# Patient Record
Sex: Male | Born: 1980 | Race: Asian | Hispanic: No | Marital: Single | Smoking: Never smoker
Health system: Southern US, Community
[De-identification: ages and names within clinical notes are randomized; demographics above are authoritative.]

## PROBLEM LIST (undated history)

## (undated) DIAGNOSIS — M79606 Pain in leg, unspecified: Secondary | ICD-10-CM

## (undated) DIAGNOSIS — R200 Anesthesia of skin: Secondary | ICD-10-CM

## (undated) DIAGNOSIS — M5126 Other intervertebral disc displacement, lumbar region: Secondary | ICD-10-CM

## (undated) DIAGNOSIS — M48061 Spinal stenosis, lumbar region without neurogenic claudication: Secondary | ICD-10-CM

## (undated) DIAGNOSIS — Z6828 Body mass index (BMI) 28.0-28.9, adult: Secondary | ICD-10-CM

## (undated) DIAGNOSIS — R030 Elevated blood-pressure reading, without diagnosis of hypertension: Secondary | ICD-10-CM

## (undated) DIAGNOSIS — R202 Paresthesia of skin: Secondary | ICD-10-CM

## (undated) DIAGNOSIS — M545 Low back pain, unspecified: Secondary | ICD-10-CM

## (undated) DIAGNOSIS — M47816 Spondylosis without myelopathy or radiculopathy, lumbar region: Secondary | ICD-10-CM

## (undated) DIAGNOSIS — M5416 Radiculopathy, lumbar region: Secondary | ICD-10-CM

## (undated) HISTORY — DX: Spondylosis without myelopathy or radiculopathy, lumbar region: M47.816

## (undated) HISTORY — DX: Low back pain, unspecified: M54.50

## (undated) HISTORY — DX: Other intervertebral disc displacement, lumbar region: M51.26

## (undated) HISTORY — DX: Radiculopathy, lumbar region: M54.16

## (undated) HISTORY — DX: Elevated blood-pressure reading, without diagnosis of hypertension: R03.0

## (undated) HISTORY — DX: Pain in leg, unspecified: M79.606

## (undated) HISTORY — DX: Anesthesia of skin: R20.0

## (undated) HISTORY — DX: Spinal stenosis, lumbar region without neurogenic claudication: M48.061

## (undated) HISTORY — DX: Anesthesia of skin: R20.2

## (undated) HISTORY — DX: Body mass index (BMI) 28.0-28.9, adult: Z68.28

---
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
## 2018-10-16 ENCOUNTER — Other Ambulatory Visit: Payer: Self-pay | Admitting: Neurosurgery

## 2018-10-23 ENCOUNTER — Ambulatory Visit (HOSPITAL_COMMUNITY): Admission: RE | Admit: 2018-10-23 | Payer: BC Managed Care – PPO | Source: Home / Self Care | Admitting: Neurosurgery

## 2018-10-23 SURGERY — LUMBAR LAMINECTOMY/DECOMPRESSION MICRODISCECTOMY 1 LEVEL
Anesthesia: General | Laterality: Left

## 2018-12-19 ENCOUNTER — Other Ambulatory Visit: Payer: Self-pay | Admitting: *Deleted

## 2018-12-22 ENCOUNTER — Other Ambulatory Visit: Payer: Self-pay | Admitting: *Deleted

## 2018-12-23 ENCOUNTER — Other Ambulatory Visit: Payer: Self-pay | Admitting: Neurosurgery

## 2018-12-23 ENCOUNTER — Other Ambulatory Visit: Payer: Self-pay | Admitting: *Deleted

## 2019-02-02 ENCOUNTER — Ambulatory Visit (INDEPENDENT_AMBULATORY_CARE_PROVIDER_SITE_OTHER): Payer: BC Managed Care – PPO | Admitting: Surgery

## 2019-02-02 ENCOUNTER — Encounter: Payer: Self-pay | Admitting: Surgery

## 2019-02-02 ENCOUNTER — Other Ambulatory Visit: Payer: Self-pay

## 2019-02-02 VITALS — BP 119/83 | HR 84 | Temp 97.9°F | Resp 20 | Ht 70.0 in | Wt 202.8 lb

## 2019-02-02 DIAGNOSIS — M479 Spondylosis, unspecified: Secondary | ICD-10-CM

## 2019-02-02 NOTE — H&P (View-Only) (Signed)
Vascular and Vein Specialist of [facility]  Patient name: Cody Fletcher MRN: [id number] DOB: 1981-02-01 Sex: male   REQUESTING PROVIDER:    Dr. Venetia Maxon   REASON FOR CONSULT:     anterior exposure L5-S1  HISTORY OF PRESENT ILLNESS:   Cody Fletcher is a 38 y.o. male, who is referred for anterior exposure of L5-S1.  He has not had any abdominal surgeries in the past.  Surgical correction of his sciatica has been recommended.  He is a non-smoker.  PAST MEDICAL HISTORY    Past Medical History:  Diagnosis Date  . Body mass index 28.0-28.9, adult   . Disc displacement, lumbar   . Elevated blood pressure reading without diagnosis of hypertension   . Leg pain   . Low back pain    With sciatica presence unspecified  . Lumbar foraminal stenosis   . Lumbar radiculopathy   . Numbness and tingling of both legs    Occasional  . Spondylosis of lumbar region without myelopathy or radiculopathy      FAMILY HISTORY   Family History  Problem Relation Age of Onset  . Arthritis Mother   . Diabetes Father     SOCIAL HISTORY:   Social History   Socioeconomic History  . Marital status: Single    Spouse name: Not on file  . Number of children: Not on file  . Years of education: Not on file  . Highest education level: Not on file  Occupational History  . Not on file  Tobacco Use  . Smoking status: Never Smoker  . Smokeless tobacco: Never Used  Substance and Sexual Activity  . Alcohol use: Not Currently  . Drug use: Never  . Sexual activity: Not on file  Other Topics Concern  . Not on file  Social History Narrative  . Not on file   Social Determinants of Health   Financial Resource Strain:   . Difficulty of Paying Living Expenses: Not on file  Food Insecurity:   . Worried About Programme researcher, broadcasting/film/video in the Last Year: Not on file  . Ran Out of Food in the Last Year: Not on file  Transportation Needs:   . Lack of Transportation (Medical): Not  on file  . Lack of Transportation (Non-Medical): Not on file  Physical Activity:   . Days of Exercise per Week: Not on file  . Minutes of Exercise per Session: Not on file  Stress:   . Feeling of Stress : Not on file  Social Connections:   . Frequency of Communication with Friends and Family: Not on file  . Frequency of Social Gatherings with Friends and Family: Not on file  . Attends Religious Services: Not on file  . Active Member of Clubs or Organizations: Not on file  . Attends Banker Meetings: Not on file  . Marital Status: Not on file  Intimate Partner Violence:   . Fear of Current or Ex-Partner: Not on file  . Emotionally Abused: Not on file  . Physically Abused: Not on file  . Sexually Abused: Not on file    ALLERGIES:    No Known Allergies  CURRENT MEDICATIONS:    Current Outpatient Medications  Medication Sig Dispense Refill  . cyclobenzaprine (FLEXERIL) 5 MG tablet Take 5 mg by mouth daily as needed for muscle spasms.    Marland Kitchen ibuprofen (ADVIL) 800 MG tablet Take 800 mg by mouth at bedtime.    . metFORMIN (GLUCOPHAGE-XR) 500 MG 24 hr tablet Take 500  mg by mouth 3 (three) times a week. At night     No current facility-administered medications for this visit.    REVIEW OF SYSTEMS:   [X]  denotes positive finding, [ ]  denotes negative finding Cardiac  Comments:  Chest pain or chest pressure:    Shortness of breath upon exertion:    Short of breath when lying flat:    Irregular heart rhythm:        Vascular    Pain in calf, thigh, or hip brought on by ambulation:    Pain in feet at night that wakes you up from your sleep:     Blood clot in your veins:    Leg swelling:         Pulmonary    Oxygen at home:    Productive cough:     Wheezing:         Neurologic    Sudden weakness in arms or legs:     Sudden numbness in arms or legs:     Sudden onset of difficulty speaking or slurred speech:    Temporary loss of vision in one eye:     Problems  with dizziness:         Gastrointestinal    Blood in stool:      Vomited blood:         Genitourinary    Burning when urinating:     Blood in urine:        Psychiatric    Major depression:         Hematologic    Bleeding problems:    Problems with blood clotting too easily:        Skin    Rashes or ulcers:        Constitutional    Fever or chills:     PHYSICAL EXAM:   Vitals:   02/02/19 1031  BP: 119/83  Pulse: 84  Resp: 20  Temp: 97.9 F (36.6 C)  SpO2: 99%  Weight: 202 lb 12.8 oz (92 kg)  Height: 5\' 10"  (1.778 m)    GENERAL: The patient is a well-nourished male, in no acute distress. The vital signs are documented above. CARDIAC: There is a regular rate and rhythm.  VASCULAR: Palpable pedal pulses PULMONARY: Nonlabored respirations ABDOMEN: Soft and non-tender with normal pitched bowel sounds.  MUSCULOSKELETAL: There are no major deformities or cyanosis. NEUROLOGIC: No focal weakness or paresthesias are detected. SKIN: There are no ulcers or rashes noted. PSYCHIATRIC: The patient has a normal affect.  STUDIES:   I have reviewed his CT scan which shows a relatively high aortic bifurcation with no significant calcification.  ASSESSMENT and PLAN   Degenerative back disease: We discussed the anterior approach to the L5-S1 disc space.  We discussed potential risks and benefits including but not limited to injury to the iliac artery and vein, ureteral injury.  We discussed the risk of retrograde ejaculation as well as hernia and wound complications.  All of his questions were answered.  His surgery is scheduled for January 7.   Leia Alf, MD, FACS Vascular and Vein Specialists of [facility] [phone] Pager [phone]

## 2019-02-02 NOTE — Progress Notes (Signed)
Vascular and Vein Specialist of [facility]  Patient name: Cody Fletcher MRN: [id number] DOB: 1981-02-01 Sex: male   REQUESTING PROVIDER:    Dr. Venetia Maxon   REASON FOR CONSULT:     anterior exposure L5-S1  HISTORY OF PRESENT ILLNESS:   Cody Fletcher is a 38 y.o. male, who is referred for anterior exposure of L5-S1.  He has not had any abdominal surgeries in the past.  Surgical correction of his sciatica has been recommended.  He is a non-smoker.  PAST MEDICAL HISTORY    Past Medical History:  Diagnosis Date  . Body mass index 28.0-28.9, adult   . Disc displacement, lumbar   . Elevated blood pressure reading without diagnosis of hypertension   . Leg pain   . Low back pain    With sciatica presence unspecified  . Lumbar foraminal stenosis   . Lumbar radiculopathy   . Numbness and tingling of both legs    Occasional  . Spondylosis of lumbar region without myelopathy or radiculopathy      FAMILY HISTORY   Family History  Problem Relation Age of Onset  . Arthritis Mother   . Diabetes Father     SOCIAL HISTORY:   Social History   Socioeconomic History  . Marital status: Single    Spouse name: Not on file  . Number of children: Not on file  . Years of education: Not on file  . Highest education level: Not on file  Occupational History  . Not on file  Tobacco Use  . Smoking status: Never Smoker  . Smokeless tobacco: Never Used  Substance and Sexual Activity  . Alcohol use: Not Currently  . Drug use: Never  . Sexual activity: Not on file  Other Topics Concern  . Not on file  Social History Narrative  . Not on file   Social Determinants of Health   Financial Resource Strain:   . Difficulty of Paying Living Expenses: Not on file  Food Insecurity:   . Worried About Programme researcher, broadcasting/film/video in the Last Year: Not on file  . Ran Out of Food in the Last Year: Not on file  Transportation Needs:   . Lack of Transportation (Medical): Not  on file  . Lack of Transportation (Non-Medical): Not on file  Physical Activity:   . Days of Exercise per Week: Not on file  . Minutes of Exercise per Session: Not on file  Stress:   . Feeling of Stress : Not on file  Social Connections:   . Frequency of Communication with Friends and Family: Not on file  . Frequency of Social Gatherings with Friends and Family: Not on file  . Attends Religious Services: Not on file  . Active Member of Clubs or Organizations: Not on file  . Attends Banker Meetings: Not on file  . Marital Status: Not on file  Intimate Partner Violence:   . Fear of Current or Ex-Partner: Not on file  . Emotionally Abused: Not on file  . Physically Abused: Not on file  . Sexually Abused: Not on file    ALLERGIES:    No Known Allergies  CURRENT MEDICATIONS:    Current Outpatient Medications  Medication Sig Dispense Refill  . cyclobenzaprine (FLEXERIL) 5 MG tablet Take 5 mg by mouth daily as needed for muscle spasms.    Marland Kitchen ibuprofen (ADVIL) 800 MG tablet Take 800 mg by mouth at bedtime.    . metFORMIN (GLUCOPHAGE-XR) 500 MG 24 hr tablet Take 500  mg by mouth 3 (three) times a week. At night     No current facility-administered medications for this visit.    REVIEW OF SYSTEMS:   [X]  denotes positive finding, [ ]  denotes negative finding Cardiac  Comments:  Chest pain or chest pressure:    Shortness of breath upon exertion:    Short of breath when lying flat:    Irregular heart rhythm:        Vascular    Pain in calf, thigh, or hip brought on by ambulation:    Pain in feet at night that wakes you up from your sleep:     Blood clot in your veins:    Leg swelling:         Pulmonary    Oxygen at home:    Productive cough:     Wheezing:         Neurologic    Sudden weakness in arms or legs:     Sudden numbness in arms or legs:     Sudden onset of difficulty speaking or slurred speech:    Temporary loss of vision in one eye:     Problems  with dizziness:         Gastrointestinal    Blood in stool:      Vomited blood:         Genitourinary    Burning when urinating:     Blood in urine:        Psychiatric    Major depression:         Hematologic    Bleeding problems:    Problems with blood clotting too easily:        Skin    Rashes or ulcers:        Constitutional    Fever or chills:     PHYSICAL EXAM:   Vitals:   02/02/19 1031  BP: 119/83  Pulse: 84  Resp: 20  Temp: 97.9 F (36.6 C)  SpO2: 99%  Weight: 202 lb 12.8 oz (92 kg)  Height: 5\' 10"  (1.778 m)    GENERAL: The patient is a well-nourished male, in no acute distress. The vital signs are documented above. CARDIAC: There is a regular rate and rhythm.  VASCULAR: Palpable pedal pulses PULMONARY: Nonlabored respirations ABDOMEN: Soft and non-tender with normal pitched bowel sounds.  MUSCULOSKELETAL: There are no major deformities or cyanosis. NEUROLOGIC: No focal weakness or paresthesias are detected. SKIN: There are no ulcers or rashes noted. PSYCHIATRIC: The patient has a normal affect.  STUDIES:   I have reviewed his CT scan which shows a relatively high aortic bifurcation with no significant calcification.  ASSESSMENT and PLAN   Degenerative back disease: We discussed the anterior approach to the L5-S1 disc space.  We discussed potential risks and benefits including but not limited to injury to the iliac artery and vein, ureteral injury.  We discussed the risk of retrograde ejaculation as well as hernia and wound complications.  All of his questions were answered.  His surgery is scheduled for January 7.   Leia Alf, MD, FACS [facility] and Vein Specialists of [facility] [phone] Pager [phone]

## 2019-02-17 NOTE — Progress Notes (Signed)
[id number] - [place], [place] - [address] [place] [address] Phone: [phone] Fax: [phone]      Your procedure is scheduled on Thursday, January 7th.  Report to [facility] Main Entrance "A" at 5:30 A.M., and check in at the Admitting office.  Call this number if you have problems the morning of surgery:  [phone]  Call [phone] if you have any questions prior to your surgery date Monday-Friday 8am-4pm    Remember:  Do not eat or drink after midnight the night before your surgery     Take these medicines the morning of surgery with A SIP OF WATER   NONE  7 days prior to surgery STOP taking any Aspirin (unless otherwise instructed by your surgeon), Aleve, Naproxen, Ibuprofen, Motrin, Advil, Goody's, BC's, all herbal medications, fish oil, and all vitamins.   WHAT DO I DO ABOUT MY DIABETES MEDICATION?   Marland Kitchen Do not take oral diabetes medicines (pills) the morning of surgery. - Metformin   HOW TO MANAGE YOUR DIABETES BEFORE AND AFTER SURGERY  Why is it important to control my blood sugar before and after surgery? . Improving blood sugar levels before and after surgery helps healing and can limit problems. . A way of improving blood sugar control is eating a healthy diet by: o  Eating less sugar and carbohydrates o  Increasing activity/exercise o  Talking with your doctor about reaching your blood sugar goals . High blood sugars (greater than 180 mg/dL) can raise your risk of infections and slow your recovery, so you will need to focus on controlling your diabetes during the weeks before surgery. . Make sure that the doctor who takes care of your diabetes knows about your planned surgery including the date and location.  How do I manage my blood sugar before surgery? . Check your blood sugar at least 4 times a day, starting 2 days before surgery, to make sure that the level is  not too high or low. . Check your blood sugar the morning of your surgery when you wake up and every 2 hours until you get to the Short Stay unit. o If your blood sugar is less than 70 mg/dL, you will need to treat for low blood sugar: - Do not take insulin. - Treat a low blood sugar (less than 70 mg/dL) with  cup of clear juice (cranberry or apple), 4 glucose tablets, OR glucose gel. - Recheck blood sugar in 15 minutes after treatment (to make sure it is greater than 70 mg/dL). If your blood sugar is not greater than 70 mg/dL on recheck, call [phone] for further instructions. . Report your blood sugar to the short stay nurse when you get to Short Stay.  . If you are admitted to the hospital after surgery: o Your blood sugar will be checked by the staff and you will probably be given insulin after surgery (instead of oral diabetes medicines) to make sure you have good blood sugar levels. o The goal for blood sugar control after surgery is 80-180 mg/dL.    The Morning of Surgery  Do not wear jewelry.  Do not wear lotions, powders, colognes, or deodorant  Men may shave face and neck.  Do not bring valuables to the hospital.  [facility] is not responsible for any belongings or valuables.  If you are a smoker, DO NOT Smoke 24 hours prior to surgery  If you wear a CPAP at night please bring your mask, tubing, and machine the morning of surgery   Remember that you must have someone to transport you home after your surgery, and remain with you for 24 hours if you are discharged the same day.   Please bring cases for contacts, glasses, hearing aids, dentures or bridgework because it cannot be worn into surgery.    Leave your suitcase in the car.  After surgery it may be brought to your room.  For patients admitted to the hospital, discharge time will be determined by your treatment team.  Patients discharged the day of surgery will not be allowed to drive home.    Special  instructions:   [place]- Preparing For Surgery  Before surgery, you can play an important role. Because skin is not sterile, your skin needs to be as free of germs as possible. You can reduce the number of germs on your skin by washing with CHG (chlorahexidine gluconate) Soap before surgery.  CHG is an antiseptic cleaner which kills germs and bonds with the skin to continue killing germs even after washing.    Oral Hygiene is also important to reduce your risk of infection.  Remember - BRUSH YOUR TEETH THE MORNING OF SURGERY WITH YOUR REGULAR TOOTHPASTE  Please do not use if you have an allergy to CHG or antibacterial soaps. If your skin becomes reddened/irritated stop using the CHG.  Do not shave (including legs and underarms) for at least 48 hours prior to first CHG shower. It is OK to shave your face.  Please follow these instructions carefully.   1. Shower the NIGHT BEFORE SURGERY and the MORNING OF SURGERY with CHG Soap.   2. If you chose to wash your hair, wash your hair first as usual with your normal shampoo.  3. After you shampoo, rinse your hair and body thoroughly to remove the shampoo.  4. Use CHG as you would any other liquid soap. You can apply CHG directly to the skin and wash gently with a scrungie or a clean washcloth.   5. Apply the CHG Soap to your body ONLY FROM THE NECK DOWN.  Do not use on open wounds or open sores. Avoid contact with your eyes, ears, mouth and genitals (private parts). Wash Face and genitals (private parts)  with your normal soap.   6. Wash thoroughly, paying special attention to the area where your surgery will be performed.  7. Thoroughly rinse your body with warm water from the neck down.  8. DO NOT shower/wash with your normal soap after using and rinsing off the CHG Soap.  9. Pat yourself dry with a CLEAN TOWEL.  10. Wear CLEAN PAJAMAS to bed the night before surgery, wear comfortable clothes the morning of surgery  11. Place CLEAN  SHEETS on your bed the night of your first shower and DO NOT SLEEP WITH PETS.    Day of Surgery:  Please shower the morning of surgery with the CHG soap Do not apply any deodorants/lotions. Please wear clean clothes to the hospital/surgery center.   Remember to brush your teeth WITH YOUR REGULAR TOOTHPASTE.   Please read over the following fact sheets that you were given.

## 2019-02-18 ENCOUNTER — Encounter (HOSPITAL_COMMUNITY)
Admission: RE | Admit: 2019-02-18 | Discharge: 2019-02-18 | Disposition: A | Discharge status: Home / Self Care | Payer: BC Managed Care – PPO | Source: Ambulatory Visit | Attending: Neurosurgery | Admitting: Neurosurgery

## 2019-02-18 ENCOUNTER — Encounter (HOSPITAL_COMMUNITY): Payer: Self-pay

## 2019-02-18 ENCOUNTER — Other Ambulatory Visit: Payer: Self-pay

## 2019-02-18 ENCOUNTER — Other Ambulatory Visit (HOSPITAL_COMMUNITY)
Admission: RE | Admit: 2019-02-18 | Discharge: 2019-02-18 | Disposition: A | Discharge status: Home / Self Care | Payer: BC Managed Care – PPO | Source: Ambulatory Visit | Attending: Neurosurgery | Admitting: Neurosurgery

## 2019-02-18 DIAGNOSIS — R9431 Abnormal electrocardiogram [ECG] [EKG]: Secondary | ICD-10-CM | POA: Insufficient documentation

## 2019-02-18 DIAGNOSIS — Z01818 Encounter for other preprocedural examination: Secondary | ICD-10-CM | POA: Insufficient documentation

## 2019-02-18 DIAGNOSIS — Z20822 Contact with and (suspected) exposure to covid-19: Secondary | ICD-10-CM | POA: Insufficient documentation

## 2019-02-18 DIAGNOSIS — M5416 Radiculopathy, lumbar region: Secondary | ICD-10-CM | POA: Insufficient documentation

## 2019-02-18 DIAGNOSIS — Z01812 Encounter for preprocedural laboratory examination: Secondary | ICD-10-CM | POA: Insufficient documentation

## 2019-02-18 DIAGNOSIS — I4510 Unspecified right bundle-branch block: Secondary | ICD-10-CM | POA: Insufficient documentation

## 2019-02-18 DIAGNOSIS — R7303 Prediabetes: Secondary | ICD-10-CM | POA: Insufficient documentation

## 2019-02-18 LAB — BASIC METABOLIC PANEL
Anion gap: 8 (ref 5–15)
BUN: 7 mg/dL (ref 6–20)
CO2: 27 mmol/L (ref 22–32)
Calcium: 9.3 mg/dL (ref 8.9–10.3)
Chloride: 103 mmol/L (ref 98–111)
Creatinine, Ser: 0.77 mg/dL (ref 0.61–1.24)
GFR calc Af Amer: >60 mL/min (ref >60)
GFR calc non Af Amer: >60 mL/min (ref >60)
Glucose, Bld: 133 mg/dL — ABNORMAL HIGH (ref 70–99)
Potassium: 3.8 mmol/L (ref 3.5–5.1)
Sodium: 138 mmol/L (ref 135–145)

## 2019-02-18 LAB — SARS CORONAVIRUS 2 (TAT 6-24 HRS): SARS Coronavirus 2: NEGATIVE

## 2019-02-18 LAB — CBC
HCT: 48.2 % (ref 39.0–52.0)
Hemoglobin: 15.3 g/dL (ref 13.0–17.0)
MCH: 26.4 pg (ref 26.0–34.0)
MCHC: 31.7 g/dL (ref 30.0–36.0)
MCV: 83.1 fL (ref 80.0–100.0)
Platelets: 275 10*3/uL (ref 150–400)
RBC: 5.8 MIL/uL (ref 4.22–5.81)
RDW: 13.1 % (ref 11.5–15.5)
WBC: 8.6 10*3/uL (ref 4.0–10.5)
nRBC: 0 % (ref 0.0–0.2)

## 2019-02-18 LAB — HEMOGLOBIN A1C
Hgb A1c MFr Bld: 6.4 % — ABNORMAL HIGH (ref 4.8–5.6)
Mean Plasma Glucose: 136.98 mg/dL

## 2019-02-18 LAB — GLUCOSE, CAPILLARY: Glucose-Capillary: 150 mg/dL — ABNORMAL HIGH (ref 70–99)

## 2019-02-18 LAB — TYPE AND SCREEN
ABO/RH(D): O POS
Antibody Screen: NEGATIVE

## 2019-02-18 LAB — SURGICAL PCR SCREEN
MRSA, PCR: NEGATIVE
Staphylococcus aureus: POSITIVE — AB

## 2019-02-18 LAB — ABO/RH: ABO/RH(D): O POS

## 2019-02-18 NOTE — Progress Notes (Signed)
PCP - Dr. Paralee Cancel - [place], [place]  PPM/ICD - Denies  Chest x-ray - Requested EKG - 02/18/2019 Stress Test - Denies ECHO - Requested Cardiac Cath - Denies  Sleep Study - Denies  Patient is pre-diabetic. Patient does not check CBG.  Aspirin Instructions: Patient stopped on 02/12/2019.  ERAS Protcol - No   COVID TEST- Scheduled today 02/18/2019   Coronavirus Screening  Have you experienced the following symptoms:  Cough yes/no: No Fever (>100.59F)  yes/no: No Runny nose yes/no: No Sore throat yes/no: No Difficulty breathing/shortness of breath  yes/no: No  Have you or a family member traveled in the last 14 days and where? yes/no: No   If the patient indicates "YES" to the above questions, their PAT will be rescheduled to limit the exposure to others and, the surgeon will be notified. THE PATIENT WILL NEED TO BE ASYMPTOMATIC FOR 14 DAYS.   If the patient is not experiencing any of these symptoms, the PAT nurse will instruct them to NOT bring anyone with them to their appointment since they may have these symptoms or traveled as well.   Please remind your patients and families that hospital visitation restrictions are in effect and the importance of the restrictions.     Anesthesia review: Yes, abnormal EKG  Patient denies shortness of breath, fever, cough and chest pain at PAT appointment   All instructions explained to the patient, with a verbal understanding of the material. Patient agrees to go over the instructions while at home for a better understanding. Patient also instructed to self quarantine after being tested for COVID-19. The opportunity to ask questions was provided.

## 2019-02-19 ENCOUNTER — Ambulatory Visit (HOSPITAL_COMMUNITY): Payer: BC Managed Care – PPO

## 2019-02-19 ENCOUNTER — Ambulatory Visit (HOSPITAL_COMMUNITY): Payer: BC Managed Care – PPO | Admitting: Anesthesiology

## 2019-02-19 ENCOUNTER — Encounter (HOSPITAL_COMMUNITY): Payer: Self-pay | Admitting: Neurosurgery

## 2019-02-19 ENCOUNTER — Ambulatory Visit (HOSPITAL_COMMUNITY): Payer: BC Managed Care – PPO | Admitting: Physician Assistant

## 2019-02-19 ENCOUNTER — Encounter (HOSPITAL_COMMUNITY)
Admission: AD | Disposition: A | Discharge status: Home / Self Care | Payer: Self-pay | Source: Home / Self Care | Attending: Neurosurgery

## 2019-02-19 ENCOUNTER — Inpatient Hospital Stay (HOSPITAL_COMMUNITY)
Admission: AD | Admit: 2019-02-19 | Discharge: 2019-02-20 | DRG: 460 | Disposition: A | Discharge status: Home / Self Care | Payer: BC Managed Care – PPO | Attending: Neurosurgery | Admitting: Neurosurgery

## 2019-02-19 DIAGNOSIS — Z20822 Contact with and (suspected) exposure to covid-19: Secondary | ICD-10-CM | POA: Diagnosis present

## 2019-02-19 DIAGNOSIS — Z833 Family history of diabetes mellitus: Secondary | ICD-10-CM

## 2019-02-19 DIAGNOSIS — M5116 Intervertebral disc disorders with radiculopathy, lumbar region: Secondary | ICD-10-CM | POA: Diagnosis present

## 2019-02-19 DIAGNOSIS — Z419 Encounter for procedure for purposes other than remedying health state, unspecified: Secondary | ICD-10-CM

## 2019-02-19 DIAGNOSIS — Z8261 Family history of arthritis: Secondary | ICD-10-CM

## 2019-02-19 DIAGNOSIS — M5117 Intervertebral disc disorders with radiculopathy, lumbosacral region: Secondary | ICD-10-CM | POA: Diagnosis not present

## 2019-02-19 DIAGNOSIS — M4807 Spinal stenosis, lumbosacral region: Secondary | ICD-10-CM | POA: Diagnosis present

## 2019-02-19 DIAGNOSIS — I10 Essential (primary) hypertension: Secondary | ICD-10-CM | POA: Diagnosis present

## 2019-02-19 DIAGNOSIS — M5126 Other intervertebral disc displacement, lumbar region: Secondary | ICD-10-CM | POA: Diagnosis present

## 2019-02-19 DIAGNOSIS — M48061 Spinal stenosis, lumbar region without neurogenic claudication: Secondary | ICD-10-CM | POA: Diagnosis present

## 2019-02-19 DIAGNOSIS — M4726 Other spondylosis with radiculopathy, lumbar region: Secondary | ICD-10-CM | POA: Diagnosis present

## 2019-02-19 DIAGNOSIS — M4727 Other spondylosis with radiculopathy, lumbosacral region: Secondary | ICD-10-CM | POA: Diagnosis not present

## 2019-02-19 HISTORY — PX: ABDOMINAL EXPOSURE: SHX5708

## 2019-02-19 HISTORY — PX: ANTERIOR LUMBAR FUSION: SHX1170

## 2019-02-19 LAB — GLUCOSE, CAPILLARY
Glucose-Capillary: 156 mg/dL — ABNORMAL HIGH (ref 70–99)
Glucose-Capillary: 242 mg/dL — ABNORMAL HIGH (ref 70–99)

## 2019-02-19 SURGERY — ANTERIOR LUMBAR FUSION 1 LEVEL
Anesthesia: General | Site: Spine Lumbar

## 2019-02-19 MED ORDER — ACETAMINOPHEN 325 MG PO TABS: 650.0000 mg | ORAL_TABLET | ORAL | Status: DC | PRN

## 2019-02-19 MED ORDER — OXYCODONE HCL 5 MG PO TABS
5.0000 mg | ORAL_TABLET | ORAL | Status: DC | PRN
  Administered 2019-02-19 (×3): 10 mg via ORAL
  Filled 2019-02-19 (×3): qty 2

## 2019-02-19 MED ORDER — HYDROMORPHONE HCL 1 MG/ML IJ SOLN
0.2500 mg | INTRAMUSCULAR | Status: DC | PRN
  Administered 2019-02-19: 0.5 mg via INTRAVENOUS

## 2019-02-19 MED ORDER — ACETAMINOPHEN 500 MG PO TABS: 1000.0000 mg | ORAL_TABLET | Freq: Once | ORAL | Status: DC

## 2019-02-19 MED ORDER — INSULIN ASPART 100 UNIT/ML ~~LOC~~ SOLN: 0.0000 [IU] | Freq: Every day | SUBCUTANEOUS | Status: DC

## 2019-02-19 MED ORDER — FENTANYL CITRATE (PF) 250 MCG/5ML IJ SOLN
INTRAMUSCULAR | Status: AC
  Filled 2019-02-19: qty 5

## 2019-02-19 MED ORDER — HEMOSTATIC AGENTS (NO CHARGE) OPTIME
TOPICAL | Status: DC | PRN
  Administered 2019-02-19: 1 via TOPICAL

## 2019-02-19 MED ORDER — INSULIN ASPART 100 UNIT/ML ~~LOC~~ SOLN
0.0000 [IU] | Freq: Three times a day (TID) | SUBCUTANEOUS | Status: DC
  Administered 2019-02-19: 3 [IU] via SUBCUTANEOUS

## 2019-02-19 MED ORDER — 0.9 % SODIUM CHLORIDE (POUR BTL) OPTIME
TOPICAL | Status: DC | PRN
  Administered 2019-02-19: 1000 mL

## 2019-02-19 MED ORDER — HYDROCODONE-ACETAMINOPHEN 5-325 MG PO TABS
1.0000 | ORAL_TABLET | ORAL | Status: DC | PRN
  Administered 2019-02-20 (×2): 2 via ORAL
  Filled 2019-02-19: qty 2
  Filled 2019-02-19 (×2): qty 1

## 2019-02-19 MED ORDER — BISACODYL 10 MG RE SUPP: 10.0000 mg | Freq: Every day | RECTAL | Status: DC | PRN

## 2019-02-19 MED ORDER — PHENYLEPHRINE HCL-NACL 10-0.9 MG/250ML-% IV SOLN
INTRAVENOUS | Status: DC | PRN
  Administered 2019-02-19: 50 ug/min via INTRAVENOUS

## 2019-02-19 MED ORDER — ACETAMINOPHEN 10 MG/ML IV SOLN
INTRAVENOUS | Status: AC
  Filled 2019-02-19: qty 100

## 2019-02-19 MED ORDER — CHLORHEXIDINE GLUCONATE CLOTH 2 % EX PADS: 6.0000 | MEDICATED_PAD | Freq: Once | CUTANEOUS | Status: DC

## 2019-02-19 MED ORDER — CHLORHEXIDINE GLUCONATE 4 % EX LIQD: 60.0000 mL | Freq: Once | CUTANEOUS | Status: DC

## 2019-02-19 MED ORDER — THROMBIN 5000 UNITS EX SOLR
CUTANEOUS | Status: AC
  Filled 2019-02-19: qty 15000

## 2019-02-19 MED ORDER — CEFAZOLIN SODIUM-DEXTROSE 2-4 GM/100ML-% IV SOLN
2.0000 g | Freq: Three times a day (TID) | INTRAVENOUS | Status: AC
  Administered 2019-02-19 (×2): 2 g via INTRAVENOUS
  Filled 2019-02-19 (×2): qty 100

## 2019-02-19 MED ORDER — INSULIN ASPART 100 UNIT/ML ~~LOC~~ SOLN
4.0000 [IU] | Freq: Three times a day (TID) | SUBCUTANEOUS | Status: DC
  Administered 2019-02-19: 4 [IU] via SUBCUTANEOUS

## 2019-02-19 MED ORDER — LIDOCAINE-EPINEPHRINE 1 %-1:100000 IJ SOLN
INTRAMUSCULAR | Status: AC
  Filled 2019-02-19: qty 1

## 2019-02-19 MED ORDER — BUPIVACAINE HCL (PF) 0.5 % IJ SOLN
INTRAMUSCULAR | Status: AC
  Filled 2019-02-19: qty 30

## 2019-02-19 MED ORDER — THROMBIN 5000 UNITS EX SOLR
CUTANEOUS | Status: DC | PRN
  Administered 2019-02-19 (×2): 5000 [IU] via TOPICAL

## 2019-02-19 MED ORDER — ROCURONIUM BROMIDE 10 MG/ML (PF) SYRINGE
PREFILLED_SYRINGE | INTRAVENOUS | Status: DC | PRN
  Administered 2019-02-19 (×4): 20 mg via INTRAVENOUS
  Administered 2019-02-19: 80 mg via INTRAVENOUS

## 2019-02-19 MED ORDER — SODIUM CHLORIDE 0.9% FLUSH: 3.0000 mL | INTRAVENOUS | Status: DC | PRN

## 2019-02-19 MED ORDER — SODIUM CHLORIDE 0.9% FLUSH
3.0000 mL | Freq: Two times a day (BID) | INTRAVENOUS | Status: DC
  Administered 2019-02-19: 3 mL via INTRAVENOUS

## 2019-02-19 MED ORDER — OXYCODONE HCL 5 MG/5ML PO SOLN: 5.0000 mg | Freq: Once | ORAL | Status: DC | PRN

## 2019-02-19 MED ORDER — DEXAMETHASONE SODIUM PHOSPHATE 10 MG/ML IJ SOLN
INTRAMUSCULAR | Status: AC
  Filled 2019-02-19: qty 1

## 2019-02-19 MED ORDER — METHOCARBAMOL 500 MG PO TABS
ORAL_TABLET | ORAL | Status: AC
  Filled 2019-02-19: qty 1

## 2019-02-19 MED ORDER — ONDANSETRON HCL 4 MG/2ML IJ SOLN: INTRAMUSCULAR | Status: DC | PRN

## 2019-02-19 MED ORDER — ROCURONIUM BROMIDE 10 MG/ML (PF) SYRINGE
PREFILLED_SYRINGE | INTRAVENOUS | Status: AC
  Filled 2019-02-19: qty 10

## 2019-02-19 MED ORDER — PHENOL 1.4 % MT LIQD: 1.0000 | OROMUCOSAL | Status: DC | PRN

## 2019-02-19 MED ORDER — PROPOFOL 10 MG/ML IV BOLUS
INTRAVENOUS | Status: DC | PRN
  Administered 2019-02-19: 200 mg via INTRAVENOUS

## 2019-02-19 MED ORDER — SUGAMMADEX SODIUM 200 MG/2ML IV SOLN
INTRAVENOUS | Status: DC | PRN
  Administered 2019-02-19: 200 mg via INTRAVENOUS

## 2019-02-19 MED ORDER — METHOCARBAMOL 1000 MG/10ML IJ SOLN
500.0000 mg | Freq: Four times a day (QID) | INTRAVENOUS | Status: DC | PRN
  Filled 2019-02-19: qty 5

## 2019-02-19 MED ORDER — ZOLPIDEM TARTRATE 5 MG PO TABS: 5.0000 mg | ORAL_TABLET | Freq: Every evening | ORAL | Status: DC | PRN

## 2019-02-19 MED ORDER — METHOCARBAMOL 500 MG PO TABS
500.0000 mg | ORAL_TABLET | Freq: Four times a day (QID) | ORAL | Status: DC | PRN
  Administered 2019-02-19 – 2019-02-20 (×4): 500 mg via ORAL
  Filled 2019-02-19 (×3): qty 1

## 2019-02-19 MED ORDER — ONDANSETRON HCL 4 MG PO TABS: 4.0000 mg | ORAL_TABLET | Freq: Four times a day (QID) | ORAL | Status: DC | PRN

## 2019-02-19 MED ORDER — MIDAZOLAM HCL 2 MG/2ML IJ SOLN
INTRAMUSCULAR | Status: AC
  Filled 2019-02-19: qty 2

## 2019-02-19 MED ORDER — METFORMIN HCL ER 500 MG PO TB24
500.0000 mg | ORAL_TABLET | ORAL | Status: DC
  Filled 2019-02-19: qty 1

## 2019-02-19 MED ORDER — LIDOCAINE 2% (20 MG/ML) 5 ML SYRINGE
INTRAMUSCULAR | Status: DC | PRN
  Administered 2019-02-19: 60 mg via INTRAVENOUS

## 2019-02-19 MED ORDER — MIDAZOLAM HCL 2 MG/2ML IJ SOLN
INTRAMUSCULAR | Status: DC | PRN
  Administered 2019-02-19: 2 mg via INTRAVENOUS

## 2019-02-19 MED ORDER — CEFAZOLIN SODIUM-DEXTROSE 2-4 GM/100ML-% IV SOLN
2.0000 g | INTRAVENOUS | Status: AC
  Administered 2019-02-19: 08:00:00 2 g via INTRAVENOUS
  Filled 2019-02-19: qty 100

## 2019-02-19 MED ORDER — PROPOFOL 10 MG/ML IV BOLUS
INTRAVENOUS | Status: AC
  Filled 2019-02-19: qty 40

## 2019-02-19 MED ORDER — ONDANSETRON HCL 4 MG/2ML IJ SOLN
INTRAMUSCULAR | Status: DC | PRN
  Administered 2019-02-19: 4 mg via INTRAVENOUS

## 2019-02-19 MED ORDER — THROMBIN 5000 UNITS EX SOLR
OROMUCOSAL | Status: DC | PRN
  Administered 2019-02-19: 5 mL via TOPICAL

## 2019-02-19 MED ORDER — ACETAMINOPHEN 10 MG/ML IV SOLN
1000.0000 mg | Freq: Once | INTRAVENOUS | Status: DC | PRN
  Administered 2019-02-19: 1000 mg via INTRAVENOUS

## 2019-02-19 MED ORDER — DEXAMETHASONE SODIUM PHOSPHATE 10 MG/ML IJ SOLN
INTRAMUSCULAR | Status: DC | PRN
  Administered 2019-02-19: 10 mg via INTRAVENOUS

## 2019-02-19 MED ORDER — HYDROMORPHONE HCL 1 MG/ML IJ SOLN
INTRAMUSCULAR | Status: AC
  Filled 2019-02-19: qty 1

## 2019-02-19 MED ORDER — LIDOCAINE 2% (20 MG/ML) 5 ML SYRINGE
INTRAMUSCULAR | Status: AC
  Filled 2019-02-19: qty 5

## 2019-02-19 MED ORDER — OXYCODONE HCL 5 MG PO TABS: 5.0000 mg | ORAL_TABLET | Freq: Once | ORAL | Status: DC | PRN

## 2019-02-19 MED ORDER — KCL IN DEXTROSE-NACL 20-5-0.45 MEQ/L-%-% IV SOLN: INTRAVENOUS | Status: DC

## 2019-02-19 MED ORDER — ACETAMINOPHEN 650 MG RE SUPP: 650.0000 mg | RECTAL | Status: DC | PRN

## 2019-02-19 MED ORDER — LACTATED RINGERS IV SOLN: INTRAVENOUS | Status: DC | PRN

## 2019-02-19 MED ORDER — SODIUM CHLORIDE 0.9 % IV SOLN
250.0000 mL | INTRAVENOUS | Status: DC
  Administered 2019-02-19: 250 mL via INTRAVENOUS

## 2019-02-19 MED ORDER — PROMETHAZINE HCL 25 MG/ML IJ SOLN: 6.2500 mg | INTRAMUSCULAR | Status: DC | PRN

## 2019-02-19 MED ORDER — MENTHOL 3 MG MT LOZG: 1.0000 | LOZENGE | OROMUCOSAL | Status: DC | PRN

## 2019-02-19 MED ORDER — DOCUSATE SODIUM 100 MG PO CAPS
100.0000 mg | ORAL_CAPSULE | Freq: Two times a day (BID) | ORAL | Status: DC
  Administered 2019-02-19 – 2019-02-20 (×3): 100 mg via ORAL
  Filled 2019-02-19 (×2): qty 1

## 2019-02-19 MED ORDER — ONDANSETRON HCL 4 MG/2ML IJ SOLN
INTRAMUSCULAR | Status: AC
  Filled 2019-02-19: qty 2

## 2019-02-19 MED ORDER — POLYETHYLENE GLYCOL 3350 17 G PO PACK
17.0000 g | PACK | Freq: Every day | ORAL | Status: DC | PRN
  Administered 2019-02-19: 17 g via ORAL
  Filled 2019-02-19: qty 1

## 2019-02-19 MED ORDER — HYDROMORPHONE HCL 1 MG/ML IJ SOLN: 1.0000 mg | INTRAMUSCULAR | Status: DC | PRN

## 2019-02-19 MED ORDER — ALUM & MAG HYDROXIDE-SIMETH 200-200-20 MG/5ML PO SUSP: 30.0000 mL | Freq: Four times a day (QID) | ORAL | Status: DC | PRN

## 2019-02-19 MED ORDER — PANTOPRAZOLE SODIUM 40 MG PO TBEC
40.0000 mg | DELAYED_RELEASE_TABLET | Freq: Every day | ORAL | Status: DC
  Administered 2019-02-19: 40 mg via ORAL
  Filled 2019-02-19: qty 1

## 2019-02-19 MED ORDER — ONDANSETRON HCL 4 MG/2ML IJ SOLN: 4.0000 mg | Freq: Four times a day (QID) | INTRAMUSCULAR | Status: DC | PRN

## 2019-02-19 MED ORDER — CYCLOBENZAPRINE HCL 5 MG PO TABS: 5.0000 mg | ORAL_TABLET | Freq: Every day | ORAL | Status: DC | PRN

## 2019-02-19 MED ORDER — FLEET ENEMA 7-19 GM/118ML RE ENEM: 1.0000 | ENEMA | Freq: Once | RECTAL | Status: DC | PRN

## 2019-02-19 MED ORDER — PANTOPRAZOLE SODIUM 40 MG IV SOLR: 40.0000 mg | Freq: Every day | INTRAVENOUS | Status: DC

## 2019-02-19 MED ORDER — FENTANYL CITRATE (PF) 100 MCG/2ML IJ SOLN
INTRAMUSCULAR | Status: DC | PRN
  Administered 2019-02-19: 100 ug via INTRAVENOUS
  Administered 2019-02-19: 250 ug via INTRAVENOUS

## 2019-02-19 SURGICAL SUPPLY — 93 items
APPLIER CLIP 11 MED OPEN (CLIP) ×2
BASE TI BOLT 5.0X22.5 VARIABLE (Bolt) ×2 IMPLANT
BASKET BONE COLLECTION (BASKET) IMPLANT
BOLT BASE TI 5X20 VARIABLE (Bolt) ×4 IMPLANT
BUR BARREL STRAIGHT FLUTE 4.0 (BURR) IMPLANT
CANISTER SUCT 3000ML PPV (MISCELLANEOUS) IMPLANT
CLIP APPLIE 11 MED OPEN (CLIP) ×1 IMPLANT
CLIP VESOCCLUDE MED 24/CT (CLIP) IMPLANT
CLIP VESOCCLUDE SM WIDE 24/CT (CLIP) IMPLANT
COVER BACK TABLE 60X90IN (DRAPES) ×2 IMPLANT
COVER WAND RF STERILE (DRAPES) ×2 IMPLANT
DECANTER SPIKE VIAL GLASS SM (MISCELLANEOUS) ×2 IMPLANT
DERMABOND ADVANCED (GAUZE/BANDAGES/DRESSINGS) ×1
DERMABOND ADVANCED .7 DNX12 (GAUZE/BANDAGES/DRESSINGS) ×1 IMPLANT
DRAPE C-ARM 42X72 X-RAY (DRAPES) ×2 IMPLANT
DRAPE C-ARMOR (DRAPES) ×2 IMPLANT
DRAPE INCISE IOBAN 66X45 STRL (DRAPES) ×2 IMPLANT
DRAPE LAPAROTOMY 100X72X124 (DRAPES) ×2 IMPLANT
DRSG OPSITE POSTOP 4X6 (GAUZE/BANDAGES/DRESSINGS) ×2 IMPLANT
DURAPREP 26ML APPLICATOR (WOUND CARE) ×2 IMPLANT
ELECT BLADE 4.0 EZ CLEAN MEGAD (MISCELLANEOUS) ×2
ELECT REM PT RETURN 9FT ADLT (ELECTROSURGICAL) ×2
ELECTRODE BLDE 4.0 EZ CLN MEGD (MISCELLANEOUS) ×1 IMPLANT
ELECTRODE REM PT RTRN 9FT ADLT (ELECTROSURGICAL) ×1 IMPLANT
GAUZE 4X4 16PLY RFD (DISPOSABLE) IMPLANT
GAUZE SPONGE 4X4 12PLY STRL (GAUZE/BANDAGES/DRESSINGS) IMPLANT
GLOVE BIO SURGEON STRL SZ8 (GLOVE) ×2 IMPLANT
GLOVE BIOGEL PI IND STRL 7.0 (GLOVE) ×2 IMPLANT
GLOVE BIOGEL PI IND STRL 7.5 (GLOVE) ×3 IMPLANT
GLOVE BIOGEL PI IND STRL 8 (GLOVE) ×2 IMPLANT
GLOVE BIOGEL PI IND STRL 8.5 (GLOVE) ×1 IMPLANT
GLOVE BIOGEL PI INDICATOR 7.0 (GLOVE) ×2
GLOVE BIOGEL PI INDICATOR 7.5 (GLOVE) ×3
GLOVE BIOGEL PI INDICATOR 8 (GLOVE) ×2
GLOVE BIOGEL PI INDICATOR 8.5 (GLOVE) ×1
GLOVE ECLIPSE 8.0 STRL XLNG CF (GLOVE) ×4 IMPLANT
GLOVE EXAM NITRILE XL STR (GLOVE) IMPLANT
GLOVE SURG SS PI 7.0 STRL IVOR (GLOVE) ×6 IMPLANT
GLOVE SURG SS PI 7.5 STRL IVOR (GLOVE) ×2 IMPLANT
GOWN STRL REUS W/ TWL LRG LVL3 (GOWN DISPOSABLE) IMPLANT
GOWN STRL REUS W/ TWL XL LVL3 (GOWN DISPOSABLE) ×4 IMPLANT
GOWN STRL REUS W/TWL 2XL LVL3 (GOWN DISPOSABLE) ×2 IMPLANT
GOWN STRL REUS W/TWL LRG LVL3 (GOWN DISPOSABLE)
GOWN STRL REUS W/TWL XL LVL3 (GOWN DISPOSABLE) ×4
HEMOSTAT POWDER KIT SURGIFOAM (HEMOSTASIS) ×2 IMPLANT
IMPL BASE TI 6X38X28MM 15DE (Neuro Prosthesis/Implant) ×1 IMPLANT
IMPLANT BASE TI 6X38X28MM 15DE (Neuro Prosthesis/Implant) ×2 IMPLANT
INSERT FOGARTY 61MM (MISCELLANEOUS) IMPLANT
INSERT FOGARTY SM (MISCELLANEOUS) IMPLANT
KIT BASIN OR (CUSTOM PROCEDURE TRAY) ×2 IMPLANT
KIT INFUSE XX SMALL 0.7CC (Orthopedic Implant) ×2 IMPLANT
KIT TURNOVER KIT B (KITS) ×2 IMPLANT
LOOP VESSEL MAXI BLUE (MISCELLANEOUS) IMPLANT
LOOP VESSEL MINI RED (MISCELLANEOUS) IMPLANT
NEEDLE HYPO 25X1 1.5 SAFETY (NEEDLE) IMPLANT
NEEDLE SPNL 18GX3.5 QUINCKE PK (NEEDLE) ×2 IMPLANT
NS IRRIG 1000ML POUR BTL (IV SOLUTION) ×2 IMPLANT
PACK LAMINECTOMY NEURO (CUSTOM PROCEDURE TRAY) ×2 IMPLANT
PAD ARMBOARD 7.5X6 YLW CONV (MISCELLANEOUS) ×6 IMPLANT
PUTTY BONE ATTRAX 10CC STRIP (Putty) ×2 IMPLANT
SPONGE INTESTINAL PEANUT (DISPOSABLE) ×10 IMPLANT
SPONGE LAP 18X18 RF (DISPOSABLE) ×2 IMPLANT
SPONGE LAP 4X18 RFD (DISPOSABLE) IMPLANT
SPONGE SURGIFOAM ABS GEL 100 (HEMOSTASIS) IMPLANT
SPONGE SURGIFOAM ABS GEL SZ50 (HEMOSTASIS) ×2 IMPLANT
STAPLER VISISTAT 35W (STAPLE) IMPLANT
SUT MNCRL AB 4-0 PS2 18 (SUTURE) IMPLANT
SUT PDS AB 1 CTX 36 (SUTURE) ×2 IMPLANT
SUT PROLENE 4 0 RB 1 (SUTURE)
SUT PROLENE 4-0 RB1 .5 CRCL 36 (SUTURE) IMPLANT
SUT PROLENE 5 0 CC1 (SUTURE) IMPLANT
SUT PROLENE 6 0 C 1 30 (SUTURE) IMPLANT
SUT PROLENE 6 0 CC (SUTURE) IMPLANT
SUT SILK 0 TIES 10X30 (SUTURE) IMPLANT
SUT SILK 2 0 TIES 10X30 (SUTURE) IMPLANT
SUT SILK 2 0 TIES 17X18 (SUTURE) ×1
SUT SILK 2 0SH CR/8 30 (SUTURE) IMPLANT
SUT SILK 2-0 18XBRD TIE BLK (SUTURE) ×1 IMPLANT
SUT SILK 3 0 TIES 10X30 (SUTURE) IMPLANT
SUT SILK 3 0SH CR/8 30 (SUTURE) IMPLANT
SUT VIC AB 0 CT1 27 (SUTURE) ×1
SUT VIC AB 0 CT1 27XBRD ANBCTR (SUTURE) ×1 IMPLANT
SUT VIC AB 2-0 CT1 18 (SUTURE) ×2 IMPLANT
SUT VIC AB 2-0 CT1 27 (SUTURE)
SUT VIC AB 2-0 CT1 TAPERPNT 27 (SUTURE) IMPLANT
SUT VIC AB 3-0 SH 27 (SUTURE)
SUT VIC AB 3-0 SH 27X BRD (SUTURE) IMPLANT
SUT VIC AB 3-0 SH 8-18 (SUTURE) ×4 IMPLANT
SUT VICRYL 4-0 PS2 18IN ABS (SUTURE) IMPLANT
TOWEL GREEN STERILE (TOWEL DISPOSABLE) ×2 IMPLANT
TOWEL GREEN STERILE FF (TOWEL DISPOSABLE) ×2 IMPLANT
TRAY FOLEY MTR SLVR 16FR STAT (SET/KITS/TRAYS/PACK) ×2 IMPLANT
WATER STERILE IRR 1000ML POUR (IV SOLUTION) ×2 IMPLANT

## 2019-02-19 NOTE — Transfer of Care (Signed)
Immediate Anesthesia Transfer of Care Note  Patient: Cody Fletcher  Procedure(s) Performed: Lumbar five Sacral one Anterior lumbar interbody fusion (N/A Spine Lumbar) ABDOMINAL EXPOSURE (N/A Spine Lumbar)  Patient Location: PACU  Anesthesia Type:General  Level of Consciousness: awake, alert  and oriented  Airway & Oxygen Therapy: Patient Spontanous Breathing and Patient connected to nasal cannula oxygen  Post-op Assessment: Report given to RN, Post -op Vital signs reviewed and stable and Patient moving all extremities  Post vital signs: Reviewed and stable  Last Vitals:  Vitals Value Taken Time  BP 121/70 02/19/19 1103  Temp 36.1 C 02/19/19 1051  Pulse 114 02/19/19 1105  Resp 16 02/19/19 1105  SpO2 97 % 02/19/19 1105  Vitals shown include unvalidated device data.  Last Pain:  Vitals:   02/19/19 1051  TempSrc:   PainSc: Asleep      Patients Stated Pain Goal: 3 (02/19/19 9622)  Complications: No apparent anesthesia complications

## 2019-02-19 NOTE — Op Note (Signed)
    Patient name: Cody Fletcher MRN: [id number] DOB: [birth year] Sex: male  02/19/2019 Pre-operative Diagnosis: Degenerative back disease Post-operative diagnosis:  Same Surgeon:  Durene Cal Co-surgeon: Dr. Venetia Maxon Procedure:   Anterior exposure, L5-S1 Anesthesia: General Blood Loss: 100 cc Specimens: None  Findings: Normal anatomy  Indications: Anterior instrumentation has been recommended by Dr. Venetia Maxon to treat L5-S1 pathology.  The risks and benefits of the procedure were discussed with the patient at his preoperative visit.  Procedure:  The patient was identified in the holding area and taken to [facility] OR ROOM 20  The patient was then placed supine on the table. general anesthesia was administered.  The patient was prepped and draped in the usual sterile fashion.  A time out was called and antibiotics were administered.  Fluoroscopy was used to determine the appropriate level of skin incision.  A left lower quadrant transverse incision was made beginning at the umbilicus and extending out towards the lateral aspect of the rectus muscle.  Cautery was used to divide the subcutaneous tissue down to the abdominal wall fascia.  The fascia was then opened with cautery.  Subfascial flaps were created.  The retroperitoneal space was then entered lateral to the rectus muscle.  Blunt dissection was used to identify the iliac artery.  I continued with mobilization of the abdominal contents superiorly and medially.  The ureter was identified and protected laterally.  A large median sacral vein was identified and ligated between silk ties.  The left and right side of the spine was mobilized.  Next the retractor was positioned.  140 and 160 blades were placed on either side of the spine as well as superiorly and inferiorly.  A spinal needle was inserted into the disc space and fluoroscopy confirmed that we are at the appropriate location.  At this point, Dr. Venetia Maxon performed his portion of the procedure.  Please see his  operative note for full details.  I did return during his portion of the procedure to help reposition the retractors.  There were no immediate complications.  Dr. Venetia Maxon performed closure of the wound after I removed the retractors.  The patient was successfully extubated and taken to recovery in stable condition.   Disposition: To PACU stable.   Juleen China, M.D., [facility] Specialists of [place] Office: [phone] Pager:  [phone]

## 2019-02-19 NOTE — Interval H&P Note (Signed)
History and Physical Interval Note:  02/19/2019 7:25 AM  Cody Fletcher  has presented today for surgery, with the diagnosis of Radiculopathy, Lumbar region.  The various methods of treatment have been discussed with the patient and family. After consideration of risks, benefits and other options for treatment, the patient has consented to  Procedure(s) with comments: Lumbar 5 Sacral 1 Anterior lumbar interbody fusion (N/A) - Lumbar 5 Sacral 1 Anterior lumbar interbody fusion ABDOMINAL EXPOSURE (N/A) as a surgical intervention.  The patient's history has been reviewed, patient examined, no change in status, stable for surgery.  I have reviewed the patient's chart and labs.  Questions were answered to the patient's satisfaction.     Durene Cal

## 2019-02-19 NOTE — Anesthesia Preprocedure Evaluation (Addendum)
Anesthesia Evaluation  Patient identified by MRN, date of birth, ID band Patient awake    Reviewed: Allergy & Precautions, NPO status , Patient's Chart, lab work & pertinent test results  Airway Mallampati: III  TM Distance: >3 FB Neck ROM: Full    Dental no notable dental hx.    Pulmonary neg pulmonary ROS,    Pulmonary exam normal breath sounds clear to auscultation       Cardiovascular negative cardio ROS Normal cardiovascular exam Rhythm:Regular Rate:Normal  ECG: rate 84   Neuro/Psych  Neuromuscular disease negative psych ROS   GI/Hepatic negative GI ROS, Neg liver ROS,   Endo/Other  negative endocrine ROS  Renal/GU negative Renal ROS     Musculoskeletal Low back pain   Abdominal   Peds  Hematology negative hematology ROS (+)   Anesthesia Other Findings Radiculopathy, Lumbar region  Reproductive/Obstetrics                            Anesthesia Physical Anesthesia Plan  ASA: II  Anesthesia Plan: General   Post-op Pain Management:    Induction: Intravenous  PONV Risk Score and Plan: 3 and Midazolam, Dexamethasone, Ondansetron and Treatment may vary due to age or medical condition  Airway Management Planned: Oral ETT  Additional Equipment:   Intra-op Plan:   Post-operative Plan: Extubation in OR  Informed Consent: I have reviewed the patients History and Physical, chart, labs and discussed the procedure including the risks, benefits and alternatives for the proposed anesthesia with the patient or authorized representative who has indicated his/her understanding and acceptance.     Dental advisory given  Plan Discussed with: CRNA  Anesthesia Plan Comments:        Anesthesia Quick Evaluation

## 2019-02-19 NOTE — Anesthesia Procedure Notes (Signed)
Procedure Name: Intubation Date/Time: 02/19/2019 7:47 AM Performed by: Leonor Liv, CRNA Pre-anesthesia Checklist: Patient identified, Emergency Drugs available, Suction available and Patient being monitored Patient Re-evaluated:Patient Re-evaluated prior to induction Oxygen Delivery Method: Circle System Utilized Preoxygenation: Pre-oxygenation with 100% oxygen Induction Type: IV induction Ventilation: Mask ventilation without difficulty Laryngoscope Size: Mac and 4 Grade View: Grade II Tube type: Oral Tube size: 7.5 mm Number of attempts: 1 Airway Equipment and Method: Stylet and Oral airway Placement Confirmation: ETT inserted through vocal cords under direct vision,  positive ETCO2 and breath sounds checked- equal and bilateral Secured at: 22 cm Tube secured with: Tape Dental Injury: Teeth and Oropharynx as per pre-operative assessment

## 2019-02-19 NOTE — Anesthesia Postprocedure Evaluation (Signed)
Anesthesia Post Note  Patient: Cody Fletcher  Procedure(s) Performed: Lumbar five Sacral one Anterior lumbar interbody fusion (N/A Spine Lumbar) ABDOMINAL EXPOSURE (N/A Spine Lumbar)     Patient location during evaluation: PACU Anesthesia Type: General Level of consciousness: awake and alert Pain management: pain level controlled Vital Signs Assessment: post-procedure vital signs reviewed and stable Respiratory status: spontaneous breathing, nonlabored ventilation, respiratory function stable and patient connected to nasal cannula oxygen Cardiovascular status: blood pressure returned to baseline and stable Postop Assessment: no apparent nausea or vomiting Anesthetic complications: no    Last Vitals:  Vitals:   02/19/19 1541 02/19/19 1631  BP: (!) 92/51 113/72  Pulse: (!) 105 (!) 108  Resp: 19 18  Temp: 36.9 C 36.8 C  SpO2: 100% 100%    Last Pain:  Vitals:   02/19/19 1631  TempSrc: Oral  PainSc:                  Eulan Heyward P Rayshun Kandler

## 2019-02-19 NOTE — Brief Op Note (Signed)
02/19/2019  10:50 AM  PATIENT:  Cody Fletcher  39 y.o. male  PRE-OPERATIVE DIAGNOSIS:  Radiculopathy, Lumbar region, herniated lumbar disc, stenosis, foraminal stenosis, lumbago L 5 S 1 level  POST-OPERATIVE DIAGNOSIS:  Radiculopathy, Lumbar region, herniated lumbar disc, stenosis, foraminal stenosis, lumbago L 5 S 1 level   PROCEDURE:  Procedure(s): Lumbar five Sacral one Anterior lumbar interbody fusion (N/A) ABDOMINAL EXPOSURE (N/A) with titanium cage and screws  SURGEON:  Surgeon(s) and Role: Panel 1:    Maeola Harman, MD - Primary Panel 2:    * Nada Libman, MD - Primary  PHYSICIAN ASSISTANT:   ASSISTANTS: Poteat, RN   ANESTHESIA:   general  EBL:  100 mL   BLOOD ADMINISTERED:none  DRAINS: none   LOCAL MEDICATIONS USED:  MARCAINE    and LIDOCAINE   SPECIMEN:  No Specimen  DISPOSITION OF SPECIMEN:  N/A  COUNTS:  YES  TOURNIQUET:  * No tourniquets in log *  DICTATION:   INDICATIONS:  Cody Fletcher is 39 year old male with chronic and intractable back and bilateral lower extremity pain with marked disc degeneration and spondylosis at the L 5 S 1 level.    It was elected to take him to surgery for anterior lumbar decompression and fusion at the L 5 S1 level.  PROCEDURE:  Doctor Myra Gianotti performed exposure and his portion of the procedure will be dictated separately.  Upon exposing the L 5 S1 level, a localizing X ray was obtained with the C arm.  I then incised the anterior annulus and performed a thorough discectomy.  The endplates were cleared of disc and cartilagenous material and a thorough discectomy was performed with decompression of the ventral annulus and disc material.  After trial, a 15 degree lordotic  6 x 38 x 28  mm Nuvasive titanium Base spacer was selected, packed with extra extra small BMP and Attrax.  The implant was tamped into position and positioning was confirmed with C arm.  The cage was then affixed to the L 5 and S1 vertebrae using 2, 5.0 x 20 mm  screws in S 1 and one  22.5 x 5.0 mm screw at L 5.  Locking mechanisms were engaged, soft tissues were inspected and found to be in good repair.  Retractors were removed.  Fascia was closed with 1 PDS running stitch, skin edges closed with 2-0 and 3-0 vicryl sutures.  Wound was dressed with Dermabond and a sterile occlusive dressing.  Patient was extubated in the OR and taken to recovery having tolerated his surgery well.  Counts were correct.   PLAN OF CARE: Admit to inpatient   PATIENT DISPOSITION:  PACU - hemodynamically stable.   Delay start of Pharmacological VTE agent (>24hrs) due to surgical blood loss or risk of bleeding: yes

## 2019-02-19 NOTE — Evaluation (Signed)
Physical Therapy Evaluation Patient Details Name: Cody Fletcher MRN: [id number] DOB: 1980-07-31 Today's Date: 02/19/2019   History of Present Illness  39 yo male s/p L5-S1 ALIF on 02/18/18. PMH unremarkable, other than ongoing back and radiating LE pain.  Clinical Impression  Pt presents with moderate back pain post-operatively, difficulty performing bed mobility, increased time and effort to mobilize post-operatively, and decreased knowledge of back precautions. Pt to benefit from acute PT to address deficits. Pt ambulated great hallway distance without AD or difficulty, with no unsteadiness or LOB noted. Pt also practiced stair navigation without difficulty. PT reviewed back precautions both verbally and through application during mobility, handout provided. PT to progress mobility as tolerated, and will continue to follow acutely.      Follow Up Recommendations Follow surgeon's recommendation for DC plan and follow-up therapies;Supervision for mobility/OOB    Equipment Recommendations  None recommended by PT    Recommendations for Other Services       Precautions / Restrictions Precautions Precautions: Fall;Back Precaution Booklet Issued: Yes (comment) Precaution Comments: handout administered and reviewed - no bending, lifting, twisting, arching spine; log roll technique in and out of bed Required Braces or Orthoses: Spinal Brace Spinal Brace: Lumbar corset;Applied in sitting position Restrictions Weight Bearing Restrictions: No      Mobility  Bed Mobility Overal bed mobility: Needs Assistance Bed Mobility: Rolling;Sidelying to Sit;Sit to Sidelying Rolling: Supervision Sidelying to sit: Min assist     Sit to sidelying: Min assist General bed mobility comments: supervision for roll to L, min assist for sidelying<>sit for trunk elevation and lowering. Getting to EOB very painful for pt .  Transfers Overall transfer level: Needs assistance Equipment used: None Transfers: Sit  to/from Stand Sit to Stand: Supervision         General transfer comment: supervision for safety, pt with slow and steady rise.  Ambulation/Gait Ambulation/Gait assistance: Supervision;Min guard Gait Distance (Feet): 400 Feet Assistive device: None Gait Pattern/deviations: Step-through pattern;Decreased stride length Gait velocity: WFL   General Gait Details: min guard initially, transitioning to supervision given pt steadiness with mobility. Verbal cuing for turning as a unit when changing directions, upright posture.  Stairs Stairs: Yes Stairs assistance: Supervision Stair Management: Alternating pattern;One rail Right;Forwards Number of Stairs: 5(2x5 steps, practiced twice limited by IV) General stair comments: supervision for safety, pt safe and steady with mobility.  Wheelchair Mobility    Modified Rankin (Stroke Patients Only)       Balance Overall balance assessment: Mild deficits observed, not formally tested                                           Pertinent Vitals/Pain Pain Assessment: 0-10 Pain Score: 4  Pain Location: back Pain Descriptors / Indicators: Sore;Discomfort;Grimacing Pain Intervention(s): Limited activity within patient's tolerance;Monitored during session;Repositioned;Premedicated before session    Home Living Family/patient expects to be discharged to:: Private residence Living Arrangements: Other relatives(brother and his family; pt is from [place]) Available Help at Discharge: Family;Available 24 hours/day Type of Home: House Home Access: Level entry     Home Layout: Two level;Bed/bath upstairs Home Equipment: None      Prior Function Level of Independence: Independent         Comments: Pt works from home, works in [employer]   Dominant Hand: Right    Extremity/Trunk Assessment   Upper Extremity Assessment  Upper Extremity Assessment: Defer to OT evaluation    Lower Extremity  Assessment Lower Extremity Assessment: Overall WFL for tasks assessed    Cervical / Trunk Assessment Cervical / Trunk Assessment: Other exceptions Cervical / Trunk Exceptions: s/p lumbar fusion  Communication   Communication: No difficulties  Cognition Arousal/Alertness: Awake/alert Behavior During Therapy: WFL for tasks assessed/performed Overall Cognitive Status: Within Functional Limits for tasks assessed                                        General Comments      Exercises     Assessment/Plan    PT Assessment Patient needs continued PT services  PT Problem List Decreased mobility;Decreased activity tolerance;Decreased balance;Decreased knowledge of use of DME;Decreased knowledge of precautions;Pain;Decreased safety awareness       PT Treatment Interventions Therapeutic activities;Gait training;Therapeutic exercise;Patient/family education;Balance training;Stair training;Functional mobility training    PT Goals (Current goals can be found in the Care Plan section)  Acute Rehab PT Goals Patient Stated Goal: go home with brother PT Goal Formulation: With patient Time For Goal Achievement: 02/26/19 Potential to Achieve Goals: Good    Frequency Min 5X/week   Barriers to discharge        Co-evaluation               AM-PAC PT "6 Clicks" Mobility  Outcome Measure Help needed turning from your back to your side while in a flat bed without using bedrails?: A Little Help needed moving from lying on your back to sitting on the side of a flat bed without using bedrails?: A Little Help needed moving to and from a bed to a chair (including a wheelchair)?: None Help needed standing up from a chair using your arms (e.g., wheelchair or bedside chair)?: None Help needed to walk in hospital room?: None Help needed climbing 3-5 steps with a railing? : None 6 Click Score: 22    End of Session Equipment Utilized During Treatment: Back brace Activity  Tolerance: Patient tolerated treatment well Patient left: in bed;with call bell/phone within reach;with bed alarm set Nurse Communication: Mobility status PT Visit Diagnosis: Other abnormalities of gait and mobility (R26.89);Pain Pain - Right/Left: (low) Pain - part of body: (back)    Time: 8270-7867 PT Time Calculation (min) (ACUTE ONLY): 25 min   Charges:   PT Evaluation $PT Eval Low Complexity: 1 Low PT Treatments $Gait Training: 8-22 mins      Shoshana Johal E, PT Acute Rehabilitation Services Pager [phone]  Office [phone]   Gustin Zobrist D Elonda Husky 02/19/2019, 5:16 PM

## 2019-02-19 NOTE — Op Note (Signed)
02/19/2019  10:50 AM  PATIENT:  Cody Fletcher  38 y.o. male  PRE-OPERATIVE DIAGNOSIS:  Radiculopathy, Lumbar region, herniated lumbar disc, stenosis, foraminal stenosis, lumbago L 5 S 1 level  POST-OPERATIVE DIAGNOSIS:  Radiculopathy, Lumbar region, herniated lumbar disc, stenosis, foraminal stenosis, lumbago L 5 S 1 level   PROCEDURE:  Procedure(s): Lumbar five Sacral one Anterior lumbar interbody fusion (N/A) ABDOMINAL EXPOSURE (N/A) with titanium cage and screws  SURGEON:  Surgeon(s) and Role: Panel 1:    * Nekita Pita, MD - Primary Panel 2:    * Brabham, Vance W, MD - Primary  PHYSICIAN ASSISTANT:   ASSISTANTS: Poteat, RN   ANESTHESIA:   general  EBL:  100 mL   BLOOD ADMINISTERED:none  DRAINS: none   LOCAL MEDICATIONS USED:  MARCAINE    and LIDOCAINE   SPECIMEN:  No Specimen  DISPOSITION OF SPECIMEN:  N/A  COUNTS:  YES  TOURNIQUET:  * No tourniquets in log *  DICTATION:   INDICATIONS:  [name] is 38 year old male with chronic and intractable back and bilateral lower extremity pain with marked disc degeneration and spondylosis at the L 5 S 1 level.    It was elected to take him to surgery for anterior lumbar decompression and fusion at the L 5 S1 level.  PROCEDURE:  Doctor Brabham performed exposure and his portion of the procedure will be dictated separately.  Upon exposing the L 5 S1 level, a localizing X ray was obtained with the C arm.  I then incised the anterior annulus and performed a thorough discectomy.  The endplates were cleared of disc and cartilagenous material and a thorough discectomy was performed with decompression of the ventral annulus and disc material.  After trial, a 15 degree lordotic  6 x 38 x 28  mm Nuvasive titanium Base spacer was selected, packed with extra extra small BMP and Attrax.  The implant was tamped into position and positioning was confirmed with C arm.  The cage was then affixed to the L 5 and S1 vertebrae using 2, 5.0 x 20 mm  screws in S 1 and one  22.5 x 5.0 mm screw at L 5.  Locking mechanisms were engaged, soft tissues were inspected and found to be in good repair.  Retractors were removed.  Fascia was closed with 1 PDS running stitch, skin edges closed with 2-0 and 3-0 vicryl sutures.  Wound was dressed with Dermabond and a sterile occlusive dressing.  Patient was extubated in the OR and taken to recovery having tolerated his surgery well.  Counts were correct.   PLAN OF CARE: Admit to inpatient   PATIENT DISPOSITION:  PACU - hemodynamically stable.   Delay start of Pharmacological VTE agent (>24hrs) due to surgical blood loss or risk of bleeding: yes  

## 2019-02-19 NOTE — Progress Notes (Signed)
Orthopedic Tech Progress Note Patient Details:  Cody Fletcher Mar 18, 1980 [id number] Called in order to [facility] for a LSO brace. RN said patient did not have brace in room. Patient ID: Cody Fletcher, male   DOB: 07-Jan-1981, 39 y.o.   MRN: [id number]   [name] 02/19/2019, 11:49 AM

## 2019-02-19 NOTE — Interval H&P Note (Signed)
History and Physical Interval Note:  02/19/2019 7:32 AM  Cody Fletcher  has presented today for surgery, with the diagnosis of Radiculopathy, Lumbar region.  The various methods of treatment have been discussed with the patient and family. After consideration of risks, benefits and other options for treatment, the patient has consented to  Procedure(s) with comments: Lumbar 5 Sacral 1 Anterior lumbar interbody fusion (N/A) - Lumbar 5 Sacral 1 Anterior lumbar interbody fusion ABDOMINAL EXPOSURE (N/A) as a surgical intervention.  The patient's history has been reviewed, patient examined, no change in status, stable for surgery.  I have reviewed the patient's chart and labs.  Questions were answered to the patient's satisfaction.     Dorian Heckle

## 2019-02-19 NOTE — H&P (Signed)
Patient ID:   [id number] Patient: Cody Fletcher  Date of Birth: 1980/05/15 Visit Type: Office Visit   Date: 11/26/2018 11:45 AM Provider: Danae Orleans. Venetia Maxon MD   This 39 year old male presents for Back pain.  HISTORY OF PRESENT ILLNESS:  1.  Back pain  the patient describes that he got at most 30% improvement in his back pain and that he can sit in the chair but he says that both of his legs are bothering him left greater than right he also notes numbness into his right leg.  I reviewed his lumbar imaging with him and his brother.  I explained that he has significant degeneration at the L5-S1 level with disc height collapse causing foraminal stenosis with compression of both L5 nerve roots left more than right as well as herniated disc material which is causing bilateral encroachment on the S1 nerve roots.    The patient does not feel he has made much progress and is very frustrated with his lack of improvement.  He has noted atrophy of his gluteus muscles.  He has borderline diabetes.    We spoke of treatment options.  I do not think that a left L5-S1 microdiskectomy will resolve his problem.  I have instead advised anterior lumbar interbody fusion at the L5-S1 level which will allow for foraminal height restoration as well as decompression of thecal sac and S1 nerve roots.    The patient is interested in proceeding with surgery.         Medical/Surgical/Interim History Reviewed, no change.  Last detailed document date:10/22/2018.     PAST MEDICAL HISTORY, SURGICAL HISTORY, FAMILY HISTORY, SOCIAL HISTORY AND REVIEW OF SYSTEMS I have reviewed the patient's past medical, surgical, family and social history as well as the comprehensive review of systems as included on the [facility] history form dated 11/26/2018, which I have signed.  Family History:  Reviewed, no changes.  Last detailed document date:10/22/2018.   Social History: Reviewed, no changes.  Last detailed document date: 10/22/2018.    MEDICATIONS: (added, continued or stopped this visit) Started Medication Directions Instruction Stopped  11/26/2018 cyclobenzaprine 5 mg tablet take 1 tablet by oral route up to 3 times per day prn spasm     Flexeril  BUCCAL   11/26/2018   ibuprofen 800 mg tablet      metformin HCl        ALLERGIES: Ingredient Reaction Medication Name Comment  NO KNOWN ALLERGIES     No known allergies.    PHYSICAL EXAM:   Vitals Date Temp F BP Pulse Ht In Wt Lb BMI BSA Pain Score  11/26/2018 96.6 126/92 87 70 197.8 28.38  7/10      IMPRESSION:     Persistent severe bilateral lower extremity pain with L5-S1 disc degeneration and height loss with L5 and S1 nerve root compression bilaterally.  PLAN:    I have recommended anterior lumbar interbody fusion at the L5-S1 level.  He wishes to proceed with surgery.  He is taking Flexeril and this was refilled.  He was given a prescription for a lumbosacral orthosis.  Risks and benefits were discussed in detail with the patient and he wishes to proceed with surgery  Orders: Diagnostic Procedures: Assessment Procedure  M54.16 Lumbar Spine- AP/Lat  Instruction(s)/Education: Assessment Instruction  R03.0 Lifestyle education  Z68.28 Dietary management education, guidance, and counseling  Miscellaneous: Assessment   M54.16 LSO Brace   Completed Orders (this encounter) Order Details Reason Side Interpretation Result  Initial Treatment Date Region  Lifestyle education Patient will monitor and contact primary care physician if needed.        Dietary management education, guidance, and counseling Encouraged patient to eat well balanced diet.         Assessment/Plan   # Detail Type Description   1. Assessment Lumbar foraminal stenosis (M48.061).       2. 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       3. Assessment Radiculopathy, lumbar region (M54.16).    Plan Orders LSO Brace.       4. Assessment Disc displacement, lumbar (M51.26).       5. Assessment Low back pain, unspecified back pain laterality, with sciatica presence unspecified (M54.5).       6. Assessment Elevated blood-pressure reading, w/o diagnosis of htn (R03.0).       7. Assessment Body mass index (BMI) 28.0-28.9, adult (D62.22).   Plan Orders Today's instructions / counseling include(s) Dietary management education, guidance, and counseling. Clinical information/comments: Encouraged patient to eat well balanced diet.         Pain Management Plan Pain Scale: 7/10. Method: Numeric Pain Intensity Scale. Location: Back. Onset: 10/22/2018. Duration: varies. Quality: discomforting. Pain management follow-up plan of care: Patient is to use pain medication as directed.     MEDICATIONS PRESCRIBED TODAY    Rx Quantity Refills  CYCLOBENZAPRINE HCL 5 mg  60 0            Provider:  Danae Orleans. Venetia Maxon MD  11/29/2018 03:50 PM    Dictation edited by: Danae Orleans. Venetia Maxon    CC Providers: Maeola Harman MD  [address] [place], [place] [address]               Electronically signed by Danae Orleans. Venetia Maxon MD on 11/29/2018 03:51 PM   Patient ID:   [id number] Patient: Cody Fletcher  Date of Birth: 10/17/1980 Visit Type: Office Visit   Date: 10/22/2018 12:30 PM Provider: Danae Orleans. Venetia Maxon MD   This 39 year old male presents for back pain.  HISTORY OF PRESENT ILLNESS:  1.  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  Patient reports some low back pain since 2016 with intermittent leg pain numbness and tingling (left or right).  He reports symptoms no longer controlled with ibuprofen.  Physical therapy, chiropractic treatments, epidural injections offered significant relief in years past - no longer offer relief.  Ibuprofen 800 mg p.r.n. Flexeril 5 mg p.r.n.  History:  Newly diagnosed prediabetes  (metformin taken rarely) Surgical history:  None  MRI and CT uploaded Canopy  The patient is complaining of bilateral lower extremity discomfort more on the left than the right.  He had an epidural injection which lasted a week to 10 days and has had facet injections since 2018 but now says that his pain is not improving.  He has been able to sit or stand for only 30 minutes at a time and has pain with walking.  He has seen 2 doctors in [place], the 1st recommended a left L5-S1 microdiskectomy, the 2nd obtained a CT scan and did blood work but then also recommended a left L5-S1 microdiskectomy without fusion surgery.  The patient's family is all in [place] and he is essentially alone in did [place].  Since he is working remotely he is planning on moving here for 4 months and says he wants to have this treated.  He has begun physical therapy and would like to continue this and see if he can have improvement but if he cannot he would like to proceed with surgery.          PAST MEDICAL/SURGICAL HISTORY:   (Detailed)    Disease/disorder Onset Date Management Date Comments  Diabetes         PAST MEDICAL HISTORY, SURGICAL HISTORY, FAMILY HISTORY, SOCIAL HISTORY AND REVIEW OF SYSTEMS I have reviewed the patient's past medical, surgical, family and social history as well as the comprehensive review of systems as included on the [facility] history form dated 10/22/2018, which I have signed.  Family History:  (Detailed) Relationship Family Member Name Deceased Age at Death Condition Onset Age Cause of Death  Father  N  Diabetes mellitus  N  Father  N  Alive and well    Mother  N  Alive and well       Social History:  (Detailed) Tobacco use reviewed. Preferred language is Unknown.   Tobacco use status: Current non-smoker. Smoking status: Never smoker.  SMOKING STATUS Type Smoking Status Usage Per Day Years Used Total Pack Years   Never smoker           MEDICATIONS: (added, continued or stopped this visit) Started Medication Directions Instruction Stopped   Flexeril  BUCCAL      ibuprofen 800 mg tablet      metformin        ALLERGIES: Ingredient Reaction Medication Name Comment  NO KNOWN ALLERGIES     No known allergies.   REVIEW OF SYSTEMS   See scanned patient registration form, dated 10/22/2018, signed and dated on 10/22/2018  Review of Systems Details System Neg/Pos Details  Constitutional Negative Chills, Fatigue, Fever, Malaise, Night sweats, Weight gain and Weight loss.  ENMT Negative Ear drainage, Hearing loss, Nasal drainage, Otalgia, Sinus pressure and Sore throat.  Eyes Negative Eye discharge, Eye pain and Vision changes.  Respiratory Negative Chronic cough, Cough, Dyspnea, Known TB exposure and Wheezing.  Cardio Negative Chest pain, Claudication, Edema and Irregular heartbeat/palpitations.  GI Negative Abdominal pain, Blood in stool, Change in stool pattern, Constipation, Decreased appetite, Diarrhea, Heartburn, Nausea and Vomiting.  GU Negative Dribbling, Dysuria, Erectile dysfunction, Hematuria, Polyuria (Genitourinary), Slow stream, Urinary frequency, Urinary incontinence and Urinary retention.  Endocrine Negative Cold intolerance, Heat intolerance, Polydipsia and Polyphagia.  Neuro Positive Numbness in extremity.  Psych Negative Anxiety, Depression and Insomnia.  Integumentary Negative Brittle hair, Brittle nails, Change in shape/size of mole(s), Hair loss, Hirsutism, Hives, Pruritus, Rash and Skin lesion.  MS Positive Back pain.  Hema/Lymph Negative Easy bleeding, Easy bruising and Lymphadenopathy.  Allergic/Immuno Negative Contact allergy, Environmental allergies, Food allergies and Seasonal allergies.  Reproductive Negative Penile discharge and Sexual dysfunction.   PHYSICAL EXAM:   Vitals Date Temp F BP Pulse Ht In Wt Lb BMI BSA Pain Score  10/22/2018 97 136/95 84 70 204.8 29.39  8/10     PHYSICAL EXAM Details General Level of Distress: no acute distress Overall Appearance: normal  Head and Face  Right Left  Fundoscopic Exam:  normal normal    Cardiovascular Cardiac: regular rate and rhythm without murmur  Right Left  Carotid Pulses: normal normal  Respiratory Lungs: clear to auscultation  Neurological Orientation: normal Recent and Remote Memory: normal Attention Span and Concentration:   normal Language: normal Fund of Knowledge: normal  Right Left Sensation: normal normal Upper Extremity Coordination: normal normal  Lower Extremity Coordination: normal normal  Musculoskeletal Gait and Station: normal  Right Left Upper Extremity Muscle Strength: normal normal Lower Extremity Muscle Strength: normal normal Upper Extremity Muscle Tone:  normal normal Lower Extremity Muscle Tone: normal normal   Motor Strength Upper and lower extremity motor strength was tested in the clinically pertinent muscles.     Deep Tendon Reflexes  Right Left Biceps: normal normal Triceps: normal normal Brachioradialis: normal normal Patellar: normal normal Achilles: normal normal  Sensory Sensation was tested at L1 to S1. Any abnormal findings will be noted below.  Right Left L5: decreased    Cranial Nerves II. Optic Nerve/Visual Fields: normal III. Oculomotor: normal IV. Trochlear: normal V. Trigeminal: normal VI. Abducens: normal VII. Facial: normal VIII. Acoustic/Vestibular: normal IX. Glossopharyngeal: normal X. Vagus: normal XI. Spinal Accessory: normal XII. Hypoglossal: normal  Motor and other Tests Lhermittes: negative Rhomberg: negative Pronator drift: absent     Right Left Hoffman's: normal normal Clonus: normal normal Babinski: normal normal SLR: negative positive at 40 degrees Patrick's Corky Sox): negative negative Toe Walk: normal normal Toe Lift: normal normal Heel Walk: normal normal SI  Joint: nontender nontender   Additional Findings:  The patient describes 8/10 pain.  He is able to bend to within 8 in of the floor with his upper extremities outstretched.  He is able to stand on his heels and toes.    IMPRESSION:   Disc degeneration with facet arthropathy but with a central to left greater than right-sided disc herniation at the L5-S1 level.  The patient has a left S1 radiculopathy as well as low back pain.  He has had minimal improvement with extensive conservative management and is working in physical therapy now.  PLAN:  Patient is going to complete physical therapy and will return to see me in 2 weeks and I will make further recommendations at that point.  Orders: Instruction(s)/Education: Assessment Instruction  I10 Lifestyle education  [phone] Dietary management education, guidance, and counseling   Assessment/Plan   # Detail Type Description   1. Assessment Disc displacement, lumbar (M51.26).       2. Assessment Low back pain, unspecified back pain laterality, with sciatica presence unspecified (M54.5).       3. Assessment Radiculopathy, lumbar region (M54.16).       4. 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       5. Assessment Essential (primary) hypertension (I10).       6. Assessment Body mass index (BMI) 29.0-29.9, adult (V76.16).   Plan Orders Today's instructions / counseling include(s) Dietary management education, guidance, and counseling. Clinical information/comments: Encouraged patient to eat well balanced diet.         Pain Management Plan Pain Scale: 8/10. Method: Numeric Pain Intensity Scale. Location: back. Onset: 10/22/2018. Duration: varies. Quality: discomforting. Pain management follow-up plan of care: Patient will continue medication management.              Provider:  Marchia Meiers. Vertell Limber MD  10/26/2018 02:45 PM    Dictation edited by: Marchia Meiers. Vertell Limber    CC Providers: Erline Levine MD   [address] [place], [place] [address]               Electronically signed by Marchia Meiers. Vertell Limber MD on 10/26/2018 02:45 PM

## 2019-02-19 NOTE — Progress Notes (Signed)
Awake, alert, conversant.  MAEW with full bilateral PF/DF strength.  Doing well.

## 2019-02-19 NOTE — Progress Notes (Signed)
Patient ID: Cody Fletcher, male   DOB: August 10, 1980, 39 y.o.   MRN: [id number] Alert, conversant. Reports mild left lumbar soreness and incisional pain only with laughter. Belly soft, passing gas. Good strength BLE. Ambukated in hallway x2. No leg pain or numbness.

## 2019-02-20 ENCOUNTER — Encounter: Payer: Self-pay | Admitting: *Deleted

## 2019-02-20 MED ORDER — METHOCARBAMOL 500 MG PO TABS: 500.0000 mg | ORAL_TABLET | Freq: Four times a day (QID) | ORAL | 1 refills | Status: AC | PRN

## 2019-02-20 MED ORDER — HYDROCODONE-ACETAMINOPHEN 5-325 MG PO TABS: 1.0000 | ORAL_TABLET | ORAL | 0 refills | Status: DC | PRN

## 2019-02-20 MED FILL — Sodium Chloride IV Soln 0.9%: INTRAVENOUS | Qty: 1000 | Status: AC

## 2019-02-20 MED FILL — Sodium Chloride Irrigation Soln 0.9%: Qty: 3000 | Status: AC

## 2019-02-20 MED FILL — Heparin Sodium (Porcine) Inj 1000 Unit/ML: INTRAMUSCULAR | Qty: 30 | Status: AC

## 2019-02-20 NOTE — Evaluation (Addendum)
Occupational Therapy Evaluation and Discharge  Patient Details Name: Cody Fletcher MRN: [id number] DOB: Apr 17, 1980 Today's Date: 02/20/2019    History of Present Illness 39 yo male s/p L5-S1 ALIF on 02/18/18. PMH unremarkable, other than ongoing back and radiating LE pain.   Clinical Impression   PTA patient independent and working. Admitted for above and limited by problem list below, including back pain, precautions.  Patient educated on brace management and wear schedule, back precautions, ADL compensatory techniques, recommendations and safety.  He demonstrates ability to complete ADLs, transfers and in room mobility with modified independence, adhering to back precautions and verbalizing understanding of techniques after education without cueing. He will have support as needed at home.  Based on performance today, no further OT needs have been identified and OT will sign off. Thank you for this referral.    Follow Up Recommendations  No OT follow up    Equipment Recommendations  None recommended by OT    Recommendations for Other Services       Precautions / Restrictions Precautions Precautions: Fall;Back Precaution Booklet Issued: Yes (comment) Precaution Comments: reviewed handout, able to recall and adhere to precautions without cueing  Required Braces or Orthoses: Spinal Brace Spinal Brace: Lumbar corset;Applied in sitting position Restrictions Weight Bearing Restrictions: No      Mobility Bed Mobility Overal bed mobility: Modified Independent Bed Mobility: Rolling;Sidelying to Sit;Sit to Sidelying           General bed mobility comments: seated EOB upon entry  Transfers Overall transfer level: Modified independent Equipment used: None Transfers: Sit to/from Stand           General transfer comment: No assist required. Pt able to complete full stand without difficulty from low bed height.     Balance Overall balance assessment: Mild deficits observed, not  formally tested                                         ADL either performed or assessed with clinical judgement   ADL Overall ADL's : Modified independent                                       General ADL Comments: patient demonstrates ability to complete ADLs at modified independent level after education on compensatory techniques due to back precautions; able to complete figure 4 technique for LB self care, reviewed compesnatory techniques for bathing, dressing, grooming and patient verbalized understanding     Vision   Vision Assessment?: No apparent visual deficits     Perception     Praxis      Pertinent Vitals/Pain Pain Assessment: Faces Faces Pain Scale: Hurts a little bit Pain Location: incision site Pain Descriptors / Indicators: Sore;Grimacing;Operative site guarding Pain Intervention(s): Monitored during session;Repositioned     Hand Dominance Right   Extremity/Trunk Assessment Upper Extremity Assessment Upper Extremity Assessment: Overall WFL for tasks assessed   Lower Extremity Assessment Lower Extremity Assessment: Defer to PT evaluation   Cervical / Trunk Assessment Cervical / Trunk Assessment: Other exceptions Cervical / Trunk Exceptions: s/p lumbar fusion   Communication Communication Communication: No difficulties   Cognition Arousal/Alertness: Awake/alert Behavior During Therapy: WFL for tasks assessed/performed Overall Cognitive Status: Within Functional Limits for tasks assessed  General Comments       Exercises     Shoulder Instructions      Home Living Family/patient expects to be discharged to:: Private residence Living Arrangements: Other relatives(brother and his family) Available Help at Discharge: Family;Available 24 hours/day Type of Home: House Home Access: Level entry     Home Layout: Two level;Bed/bath upstairs Alternate Level  Stairs-Number of Steps: flight Alternate Level Stairs-Rails: Right Bathroom Shower/Tub: Producer, television/film/video: Standard     Home Equipment: Grab bars - tub/shower          Prior Functioning/Environment Level of Independence: Independent        Comments: Pt works from home, works in [employer] manager Problem List: Decreased activity tolerance;Pain;Decreased knowledge of precautions;Decreased knowledge of use of DME or AE      OT Treatment/Interventions:      OT Goals(Current goals can be found in the care plan section) Acute Rehab OT Goals Patient Stated Goal: home today OT Goal Formulation: With patient  OT Frequency:     Barriers to D/C:            Co-evaluation              AM-PAC OT "6 Clicks" Daily Activity     Outcome Measure Help from another person eating meals?: None Help from another person taking care of personal grooming?: None Help from another person toileting, which includes using toliet, bedpan, or urinal?: None Help from another person bathing (including washing, rinsing, drying)?: None Help from another person to put on and taking off regular upper body clothing?: None Help from another person to put on and taking off regular lower body clothing?: None 6 Click Score: 24   End of Session Equipment Utilized During Treatment: Back brace Nurse Communication: Mobility status  Activity Tolerance: Patient tolerated treatment well Patient left: with call bell/phone within reach;Other (comment)(seated EOB )  OT Visit Diagnosis: Other abnormalities of gait and mobility (R26.89);Pain Pain - part of body: (back)                Time: 9562-1308 OT Time Calculation (min): 14 min Charges:  OT General Charges $OT Visit: 1 Visit OT Evaluation $OT Eval Low Complexity: 1 Low  Barry Brunner, OT Acute Rehabilitation Services Pager [phone] Office [phone]   Chancy Milroy 02/20/2019, 10:00 AM

## 2019-02-20 NOTE — Discharge Instructions (Signed)

## 2019-02-20 NOTE — Discharge Summary (Signed)
Physician Discharge Summary  Patient ID: Cody Fletcher MRN: [id number] DOB/AGE: [birth year] 39 y.o.  Admit date: 02/19/2019 Discharge date: 02/20/2019  Admission Diagnoses: Radiculopathy, Lumbar region, herniated lumbar disc, stenosis, foraminal stenosis, lumbago L 5 S 1 level    Discharge Diagnoses: Radiculopathy, Lumbar region, herniated lumbar disc, stenosis, foraminal stenosis, lumbago L 5 S 1 level s/p Lumbar five Sacral one Anterior lumbar interbody fusion (N/A) ABDOMINAL EXPOSURE (N/A) with titanium cage and screws     Active Problems:   Herniated lumbar disc without myelopathy   Discharged Condition: good  Hospital Course: Cody Fletcher was admitted for surgery with dx lumbar HNP, stenosis, and radiculopathy. Following uncomplicated L5-S1 ALIF, he recovered nicely and transferred to [facility] for nursing care and therapies, He is mobilizing well.   Consults: vascular surgery  Significant Diagnostic Studies: radiology: X-Ray: intra-op  Treatments: surgery: Lumbar five Sacral one Anterior lumbar interbody fusion (N/A) ABDOMINAL EXPOSURE (N/A) with titanium cage and screws    Discharge Exam: Blood pressure 135/81, pulse 95, temperature 98 F (36.7 C), temperature source Oral, resp. rate 18, height 5\' 10"  (1.778 m), weight 90.4 kg, SpO2 100 %. Alert, conversant. Good strength BLE. Mild intermittent tingling both feet, without change (reassured). Incision without erythema, swelling or drainage beneath honeycomb and Dermabond. Belly soft and nontender. Passing gas. Oxycodone was changed to Norco overnight for soft BP. He remains asymptomatic and is doing well on the Norco.    Disposition:  Discharge to home after working with PT. Will plan to send Norco 5/325 and Robaxin 500mg  rx's to his pharmacy if tolerated this am with increased mobility. Office f/u in 3-4 weeks.    Allergies as of 02/20/2019   No Known Allergies     Medication List    STOP taking these  medications   ibuprofen 800 MG tablet Commonly known as: ADVIL     TAKE these medications   cyclobenzaprine 5 MG tablet Commonly known as: FLEXERIL Take 5 mg by mouth daily as needed for muscle spasms.   HYDROcodone-acetaminophen 5-325 MG tablet Commonly known as: NORCO/VICODIN Take 1-2 tablets by mouth every 4 (four) hours as needed for moderate pain ((score 7 to 10)).   metFORMIN 500 MG 24 hr tablet Commonly known as: GLUCOPHAGE-XR Take 500 mg by mouth 3 (three) times a week. At night   methocarbamol 500 MG tablet Commonly known as: ROBAXIN Take 1 tablet (500 mg total) by mouth every 6 (six) hours as needed for muscle spasms.        Signed: , MD 02/20/2019, 8:14 AM

## 2019-02-20 NOTE — Plan of Care (Signed)
Patient alert and oriented, mae's well, voiding adequate amount of urine, swallowing without difficulty, no c/o pain at time of discharge. Patient discharged home with family. Script and discharged instructions given to patient. Patient and family stated understanding of instructions given. Patient has an appointment with Dr.Stern    

## 2019-02-20 NOTE — Progress Notes (Addendum)
Subjective: Patient reports "I feel ok. I'm a little more sore this morning"  Objective: Vital signs in last 24 hours: Temp:  [97 F (36.1 C)-99.2 F (37.3 C)] 98 F (36.7 C) (01/08 0721) Pulse Rate:  [95-114] 95 (01/08 0721) Resp:  [13-22] 18 (01/08 0721) BP: (92-135)/(51-81) 135/81 (01/08 0721) SpO2:  [97 %-100 %] 100 % (01/08 0721)  Intake/Output from previous day: 01/07 0701 - 01/08 0700 In: 2300 [I.V.:2300] Out: 220 [Urine:120; Blood:100] Intake/Output this shift: No intake/output data recorded.  Alert, conversant. Good strength BLE. Mild intermittent tingling both feet, without change (reassured). Incision without erythema, swelling or drainage beneath honeycomb and Dermabond. Belly soft and nontender. Passing gas. Oxycodone was changed to Norco overnight for soft BP. He remains asymptomatic and is doing well on the Norco.   Lab Results: Recent Labs    02/18/19 1150  WBC 8.6  HGB 15.3  HCT 48.2  PLT 275   BMET Recent Labs    02/18/19 1150  NA 138  K 3.8  CL 103  CO2 27  GLUCOSE 133*  BUN 7  CREATININE 0.77  CALCIUM 9.3    Studies/Results: DG Lumbar Spine 2-3 Views  Result Date: 02/19/2019 CLINICAL DATA:  L5-S1 fusion. EXAM: DG C-ARM 1-60 MIN; LUMBAR SPINE - 2-3 VIEW CONTRAST:  None. FLUOROSCOPY TIME:  Fluoroscopy Time:  0 minutes 32 seconds Number of Acquired Spot Images: 3 COMPARISON:  Lumbar spine 09/24/2018. FINDINGS: Lumbar spine numbered the lowest segmented appearing lumbar shaped vertebrae on lateral view as L5. L5-S1 fusion. Hardware intact. Anatomic alignment. No acute bony abnormality identified. IMPRESSION: L5-S1 fusion.  Hardware intact.  Anatomic alignment Electronically Signed   By: Maisie Fus  Register   On: 02/19/2019 11:53   DG C-Arm 1-60 Min  Result Date: 02/19/2019 CLINICAL DATA:  L5-S1 fusion. EXAM: DG C-ARM 1-60 MIN; LUMBAR SPINE - 2-3 VIEW CONTRAST:  None. FLUOROSCOPY TIME:  Fluoroscopy Time:  0 minutes 32 seconds Number of Acquired Spot  Images: 3 COMPARISON:  Lumbar spine 09/24/2018. FINDINGS: Lumbar spine numbered the lowest segmented appearing lumbar shaped vertebrae on lateral view as L5. L5-S1 fusion. Hardware intact. Anatomic alignment. No acute bony abnormality identified. IMPRESSION: L5-S1 fusion.  Hardware intact.  Anatomic alignment Electronically Signed   By: Maisie Fus  Register   On: 02/19/2019 11:53   DG OR LOCAL ABDOMEN  Result Date: 02/19/2019 CLINICAL DATA:  Postoperative evaluation for foreign body. EXAM: OR LOCAL ABDOMEN COMPARISON:  Prior Study 02/19/2019. FINDINGS: Patient is status post lumbosacral fusion. Hardware intact. Anatomic alignment. No retained metallic foreign bodies noted. No bowel distention. IMPRESSION: No retained metallic foreign bodies noted. Electronically Signed   By: Maisie Fus  Register   On: 02/19/2019 10:33    Assessment/Plan: Improving  LOS: 1 day  Ok per DrStern to d/c to home after working with PT. Will plan to send Norco 5/325 and Robaxin 500mg  rx's to his pharmacy if tolerated this am with increased mobility. Office f/u in 3-4 weeks.   02/20/2019, 7:54 AM   Patient is doing well.  Discharge home after PT.

## 2019-02-20 NOTE — Progress Notes (Signed)
    Subjective  - POD #1  C/o incisional soreness and frequent urination   Physical Exam:  Abs soft Palpable right PT pulse       Assessment/Plan:  POD #1  Stable from exposure perspective He has ambulated, voided, and tolerated his diet.   probable d/c today  Cody Fletcher 02/20/2019 7:46 AM --  Vitals:   02/20/19 0446 02/20/19 0721  BP: 99/64 135/81  Pulse: (!) 102 95  Resp: 20 18  Temp: 99.2 F (37.3 C) 98 F (36.7 C)  SpO2: 100% 100%    Intake/Output Summary (Last 24 hours) at 02/20/2019 0746 Last data filed at 02/19/2019 1115 Gross per 24 hour  Intake 2300 ml  Output 220 ml  Net 2080 ml     Laboratory CBC    Component Value Date/Time   WBC 8.6 02/18/2019 1150   HGB 15.3 02/18/2019 1150   HCT 48.2 02/18/2019 1150   PLT 275 02/18/2019 1150    BMET    Component Value Date/Time   NA 138 02/18/2019 1150   K 3.8 02/18/2019 1150   CL 103 02/18/2019 1150   CO2 27 02/18/2019 1150   GLUCOSE 133 (H) 02/18/2019 1150   BUN 7 02/18/2019 1150   CREATININE 0.77 02/18/2019 1150   CALCIUM 9.3 02/18/2019 1150   GFRNONAA >60 02/18/2019 1150   GFRAA >60 02/18/2019 1150    COAG No results found for: INR, PROTIME No results found for: PTT  Antibiotics Anti-infectives (From admission, onward)   Start     Dose/Rate Route Frequency Ordered Stop   02/19/19 1530  ceFAZolin (ANCEF) IVPB 2g/100 mL premix     2 g 200 mL/hr over 30 Minutes Intravenous Every 8 hours 02/19/19 1135 02/19/19 2252   02/19/19 0630  ceFAZolin (ANCEF) IVPB 2g/100 mL premix     2 g 200 mL/hr over 30 Minutes Intravenous On call to O.R. 02/19/19 0622 02/19/19 0801       V. Charlena Cross, M.D., [facility] [place] Office: [phone] Pager:  [phone]

## 2019-02-20 NOTE — Progress Notes (Signed)
Physical Therapy Treatment and Discharge Patient Details Name: Nathan Moctezuma MRN: [id number] DOB: Aug 13, 1980 Today's Date: 02/20/2019    History of Present Illness 39 yo male s/p L5-S1 ALIF on 02/18/18. PMH unremarkable, other than ongoing back and radiating LE pain.    PT Comments    Pt progressing well with post-op mobility. He was able to demonstrate transfers and ambulation with gross modified independence and no AD. Reinforced education on precautions, brace application/wearing schedule, appropriate activity progression, and car transfer. Pt has met acute PT goals at this time and we will sign off. If needs change, please reconsult.       Follow Up Recommendations  No PT follow up;Supervision - Intermittent     Equipment Recommendations  None recommended by PT    Recommendations for Other Services       Precautions / Restrictions Precautions Precautions: Fall;Back Precaution Booklet Issued: Yes (comment) Precaution Comments: Reviewed handout - pt was able to recall 3/3 precautions. VC's for precautions during functional mobility.  Required Braces or Orthoses: Spinal Brace Spinal Brace: Lumbar corset;Applied in sitting position Restrictions Weight Bearing Restrictions: No    Mobility  Bed Mobility Overal bed mobility: Modified Independent Bed Mobility: Rolling;Sidelying to Sit;Sit to Sidelying           General bed mobility comments: Able to complete log roll with HOB flat and rails lowered to simulate home environment. No assist required.   Transfers Overall transfer level: Modified independent Equipment used: None Transfers: Sit to/from Stand           General transfer comment: No assist required. Pt able to complete full stand without difficulty from low bed height.   Ambulation/Gait Ambulation/Gait assistance: Modified independent (Device/Increase time) Gait Distance (Feet): 350 Feet Assistive device: None Gait Pattern/deviations: Step-through  pattern;Decreased stride length;Trunk flexed Gait velocity: WFL Gait velocity interpretation: >2.62 ft/sec, indicative of community ambulatory General Gait Details: Mildly flexed trunk due to pain but overall good posture with casual gait pattern.    Stairs Stairs: Yes Stairs assistance: Modified independent (Device/Increase time) Stair Management: Alternating pattern;One rail Right;Forwards Number of Stairs: 10 General stair comments: No assist required. No unsteadiness or LOB noted.    Wheelchair Mobility    Modified Rankin (Stroke Patients Only)       Balance Overall balance assessment: Mild deficits observed, not formally tested                                          Cognition Arousal/Alertness: Awake/alert Behavior During Therapy: WFL for tasks assessed/performed Overall Cognitive Status: Within Functional Limits for tasks assessed                                        Exercises      General Comments        Pertinent Vitals/Pain Pain Assessment: Faces Faces Pain Scale: Hurts little more Pain Location: incision site Pain Descriptors / Indicators: Sore;Grimacing;Operative site guarding Pain Intervention(s): Limited activity within patient's tolerance;Monitored during session;Repositioned    Home Living                      Prior Function            PT Goals (current goals can now be found in the care plan section) Acute Rehab  PT Goals Patient Stated Goal: home today PT Goal Formulation: With patient Time For Goal Achievement: 02/26/19 Potential to Achieve Goals: Good Progress towards PT goals: Goals met/education completed, patient discharged from PT    Frequency    Min 5X/week      PT Plan Current plan remains appropriate    Co-evaluation              AM-PAC PT "6 Clicks" Mobility   Outcome Measure  Help needed turning from your back to your side while in a flat bed without using  bedrails?: None Help needed moving from lying on your back to sitting on the side of a flat bed without using bedrails?: None Help needed moving to and from a bed to a chair (including a wheelchair)?: None Help needed standing up from a chair using your arms (e.g., wheelchair or bedside chair)?: None Help needed to walk in hospital room?: None Help needed climbing 3-5 steps with a railing? : None 6 Click Score: 24    End of Session Equipment Utilized During Treatment: Back brace Activity Tolerance: Patient tolerated treatment well Patient left: in bed;with call bell/phone within reach;with bed alarm set Nurse Communication: Mobility status PT Visit Diagnosis: Other abnormalities of gait and mobility (R26.89);Pain Pain - Right/Left: (low) Pain - part of body: (back)     Time: 4098-1191 PT Time Calculation (min) (ACUTE ONLY): 15 min  Charges:  $Gait Training: 8-22 mins                     Rolinda Roan, PT, DPT Acute Rehabilitation Services Pager: [phone] Office: [phone]    Thelma Comp 02/20/2019, 9:42 AM

## 2019-03-02 ENCOUNTER — Emergency Department (HOSPITAL_COMMUNITY)
Admission: EM | Admit: 2019-03-02 | Discharge: 2019-03-02 | Discharge status: Against Medical Advice | Payer: BC Managed Care – PPO | Attending: Emergency Medicine | Admitting: Emergency Medicine

## 2019-03-02 ENCOUNTER — Encounter (HOSPITAL_COMMUNITY): Payer: Self-pay | Admitting: Emergency Medicine

## 2019-03-02 ENCOUNTER — Other Ambulatory Visit: Payer: Self-pay

## 2019-03-02 DIAGNOSIS — Z5321 Procedure and treatment not carried out due to patient leaving prior to being seen by health care provider: Secondary | ICD-10-CM | POA: Insufficient documentation

## 2019-03-02 DIAGNOSIS — L7622 Postprocedural hemorrhage and hematoma of skin and subcutaneous tissue following other procedure: Secondary | ICD-10-CM | POA: Insufficient documentation

## 2019-03-02 NOTE — ED Notes (Signed)
Pt stated he was leaving. 

## 2019-03-02 NOTE — ED Triage Notes (Signed)
Pt st's he had a fusion on his back on 1/7  St's surgical site started bleeding 2 hours ago.  Pt has small amount of bleeding on bandage

## 2019-03-09 ENCOUNTER — Other Ambulatory Visit: Payer: Self-pay

## 2019-03-09 ENCOUNTER — Encounter: Payer: Self-pay | Admitting: Emergency Medicine

## 2019-03-09 ENCOUNTER — Emergency Department
Admission: EM | Admit: 2019-03-09 | Discharge: 2019-03-09 | Disposition: A | Discharge status: Home / Self Care | Payer: BC Managed Care – PPO | Source: Home / Self Care | Attending: Family Medicine | Admitting: Family Medicine

## 2019-03-09 DIAGNOSIS — T8131XA Disruption of external operation (surgical) wound, not elsewhere classified, initial encounter: Secondary | ICD-10-CM | POA: Diagnosis not present

## 2019-03-09 MED ORDER — DOXYCYCLINE HYCLATE 100 MG PO CAPS: 100.0000 mg | ORAL_CAPSULE | Freq: Two times a day (BID) | ORAL | 0 refills | Status: AC

## 2019-03-09 NOTE — Discharge Instructions (Addendum)
Change dressing daily with non-stick dressing such as Telfa.  Keep wound clean and dry.

## 2019-03-09 NOTE — ED Provider Notes (Signed)
[facility]    CSN: [id number] Arrival date & time: 03/09/19  1801      History   Chief Complaint Chief Complaint  Patient presents with  . Wound Check    HPI Cody Fletcher is a 39 y.o. male.   18 days ago patient underwent anterior lumbar interbody fusion through an abdominal exposure.  His incision had been healing well until five days ago when bleeding and a small amount of drainage began to appear at each end of his abdominal incision.  He has had mild surrounding tenderness.  He feels well otherwise and denies fevers, chills, and sweats.  He has a follow-up appointment with his neurosurgeon in two days.  The history is provided by the patient.    Past Medical History:  Diagnosis Date  . Body mass index 28.0-28.9, adult   . Disc displacement, lumbar   . Elevated blood pressure reading without diagnosis of hypertension   . Leg pain   . Low back pain    With sciatica presence unspecified  . Lumbar foraminal stenosis   . Lumbar radiculopathy   . Numbness and tingling of both legs    Occasional  . Spondylosis of lumbar region without myelopathy or radiculopathy     Patient Active Problem List   Diagnosis Date Noted  . Herniated lumbar disc without myelopathy 02/19/2019    Past Surgical History:  Procedure Laterality Date  . ABDOMINAL EXPOSURE N/A 02/19/2019   Procedure: ABDOMINAL EXPOSURE;  Surgeon: Nada Libman, MD;  Location: [facility] OR;  Service: Vascular;  Laterality: N/A;  . ANTERIOR LUMBAR FUSION N/A 02/19/2019   Procedure: Lumbar five Sacral one Anterior lumbar interbody fusion;  Surgeon: Maeola Harman, MD;  Location: [facility] OR;  Service: Neurosurgery;  Laterality: N/A;       Home Medications    Prior to Admission medications   Medication Sig Start Date End Date Taking? Authorizing Provider  doxycycline (VIBRAMYCIN) 100 MG capsule Take 1 capsule (100 mg total) by mouth 2 (two) times daily. Take with food. 03/09/19   Lattie Haw, MD   methocarbamol (ROBAXIN) 500 MG tablet Take 1 tablet (500 mg total) by mouth every 6 (six) hours as needed for muscle spasms. 02/20/19   Maeola Harman, MD    Family History Family History  Problem Relation Age of Onset  . Arthritis Mother   . Diabetes Father     Social History Social History   Tobacco Use  . Smoking status: Never Smoker  . Smokeless tobacco: Never Used  Substance Use Topics  . Alcohol use: Not Currently  . Drug use: Never     Allergies   Patient has no known allergies.   Review of Systems Review of Systems  Constitutional: Negative for activity change, appetite change, chills, diaphoresis, fatigue and fever.  HENT: Negative.   Eyes: Negative.   Respiratory: Negative.   Cardiovascular: Negative.   Gastrointestinal: Negative.   Genitourinary: Negative.   Musculoskeletal: Negative.   Skin: Positive for wound. Negative for color change and rash.  Neurological: Negative.      Physical Exam Triage Vital Signs ED Triage Vitals  Enc Vitals Group     BP 03/09/19 1931 117/79     Pulse Rate 03/09/19 1931 86     Resp --      Temp 03/09/19 1931 98.9 F (37.2 C)     Temp Source 03/09/19 1931 Oral     SpO2 03/09/19 1931 98 %     Weight 03/09/19 1932  209 lb (94.8 kg)     Height 03/09/19 1932 5\' 10"  (1.778 m)     Head Circumference --      Peak Flow --      Pain Score 03/09/19 1931 1     Pain Loc --      Pain Edu? --      Excl. in [place]? --    No data found.  Updated Vital Signs BP 117/79 (BP Location: Right Arm)   Pulse 86   Temp 98.9 F (37.2 C) (Oral)   Ht 5\' 10"  (1.778 m)   Wt 94.8 kg   SpO2 98%   BMI 29.99 kg/m   Visual Acuity Right Eye Distance:   Left Eye Distance:   Bilateral Distance:    Right Eye Near:   Left Eye Near:    Bilateral Near:     Physical Exam Vitals reviewed.  Constitutional:      General: He is not in acute distress.    Appearance: He is not diaphoretic.  HENT:     Head: Normocephalic.  Eyes:     Pupils:  Pupils are equal, round, and reactive to light.  Cardiovascular:     Rate and Rhythm: Normal rate.     Heart sounds: Normal heart sounds.  Pulmonary:     Breath sounds: Normal breath sounds.  Abdominal:     General: There is no distension.     Palpations: Abdomen is soft.     Tenderness: There is abdominal tenderness. There is no guarding.       Comments: Lower abdominal surgical scar appears to be healing well centrally, but at each end there is a small length of shallow dehiscence with scant purulent drainage.  The area superior to the wound is slightly tender to palpation but there is no warmth, erythema or swelling.  No induration or discharge.  Musculoskeletal:     Cervical back: Neck supple.  Skin:    General: Skin is warm and dry.  Neurological:     Mental Status: He is alert.      UC Treatments / Results  Labs (all labs ordered are listed, but only abnormal results are displayed) Labs Reviewed  WOUND CULTURE    EKG   Radiology No results found.  Procedures Procedures (including critical care time)  Medications Ordered in UC Medications - No data to display  Initial Impression / Assessment and Plan / UC Course  I have reviewed the triage vital signs and the nursing notes.  Pertinent labs & imaging results that were available during my care of the patient were reviewed by me and considered in my medical decision making (see chart for details).    Wound culture pending.  Begin doxycycline 100mg  BID for staph coverage.  Followup with neurosurgeon in two days as scheduled.  Final Clinical Impressions(s) / UC Diagnoses   Final diagnoses:  Open abdominal incision with drainage, initial encounter     Discharge Instructions     Change dressing daily with non-stick dressing such as Telfa.  Keep wound clean and dry.       ED Prescriptions    Medication Sig Dispense Auth. Provider   doxycycline (VIBRAMYCIN) 100 MG capsule Take 1 capsule (100 mg total) by  mouth 2 (two) times daily. Take with food. 14 capsule Kandra Nicolas, MD        Kandra Nicolas, MD 03/11/19 [phone]

## 2019-03-09 NOTE — ED Triage Notes (Signed)
Open incision x 5 days, patient has not had the incision bandaged and a small section has reopened.A small amount of bleeding and a small amount of pain 1/2

## 2019-03-11 ENCOUNTER — Telehealth: Payer: Self-pay

## 2019-03-11 NOTE — Telephone Encounter (Signed)
Pt called to check on WCX results. Pt informed that there are no results yet.

## 2019-03-12 ENCOUNTER — Telehealth: Payer: Self-pay

## 2019-03-12 NOTE — Telephone Encounter (Signed)
Noticed pt of WCX results. Per Langston Masker, doxycycline should cover staph. Advised pt to be sure to take entire course. F/u with surgeon if any changes.

## 2019-03-13 LAB — WOUND CULTURE
MICRO NUMBER:: 10081590
SPECIMEN QUALITY:: ADEQUATE

## 2020-08-07 ENCOUNTER — Emergency Department (HOSPITAL_COMMUNITY)
Admission: EM | Admit: 2020-08-07 | Discharge: 2020-08-08 | Disposition: A | Discharge status: Home / Self Care | Payer: BC Managed Care – PPO | Attending: Emergency Medicine | Admitting: Emergency Medicine

## 2020-08-07 ENCOUNTER — Emergency Department (HOSPITAL_COMMUNITY): Payer: BC Managed Care – PPO

## 2020-08-07 ENCOUNTER — Other Ambulatory Visit: Payer: Self-pay

## 2020-08-07 ENCOUNTER — Encounter (HOSPITAL_COMMUNITY): Payer: Self-pay | Admitting: Emergency Medicine

## 2020-08-07 DIAGNOSIS — M545 Low back pain, unspecified: Secondary | ICD-10-CM | POA: Diagnosis present

## 2020-08-07 DIAGNOSIS — M5442 Lumbago with sciatica, left side: Secondary | ICD-10-CM | POA: Diagnosis not present

## 2020-08-07 DIAGNOSIS — R202 Paresthesia of skin: Secondary | ICD-10-CM | POA: Insufficient documentation

## 2020-08-07 DIAGNOSIS — R1084 Generalized abdominal pain: Secondary | ICD-10-CM | POA: Diagnosis not present

## 2020-08-07 DIAGNOSIS — R000 Tachycardia, unspecified: Secondary | ICD-10-CM | POA: Diagnosis not present

## 2020-08-07 LAB — COMPREHENSIVE METABOLIC PANEL
ALT: 26 U/L (ref 0–44)
AST: 24 U/L (ref 15–41)
Albumin: 3.7 g/dL (ref 3.5–5.0)
Alkaline Phosphatase: 49 U/L (ref 38–126)
Anion gap: 6 (ref 5–15)
BUN: 11 mg/dL (ref 6–20)
CO2: 26 mmol/L (ref 22–32)
Calcium: 8.9 mg/dL (ref 8.9–10.3)
Chloride: 104 mmol/L (ref 98–111)
Creatinine, Ser: 0.83 mg/dL (ref 0.61–1.24)
GFR, Estimated: >60 mL/min (ref >60)
Glucose, Bld: 114 mg/dL — ABNORMAL HIGH (ref 70–99)
Potassium: 4.2 mmol/L (ref 3.5–5.1)
Sodium: 136 mmol/L (ref 135–145)
Total Bilirubin: 0.4 mg/dL (ref 0.3–1.2)
Total Protein: 6.8 g/dL (ref 6.5–8.1)

## 2020-08-07 LAB — CBC WITH DIFFERENTIAL/PLATELET
Abs Immature Granulocytes: 0.01 10*3/uL (ref 0.00–0.07)
Basophils Absolute: 0 10*3/uL (ref 0.0–0.1)
Basophils Relative: 0 %
Eosinophils Absolute: 0.2 10*3/uL (ref 0.0–0.5)
Eosinophils Relative: 3 %
HCT: 45.2 % (ref 39.0–52.0)
Hemoglobin: 14.6 g/dL (ref 13.0–17.0)
Immature Granulocytes: 0 %
Lymphocytes Relative: 41 %
Lymphs Abs: 3 10*3/uL (ref 0.7–4.0)
MCH: 26.9 pg (ref 26.0–34.0)
MCHC: 32.3 g/dL (ref 30.0–36.0)
MCV: 83.4 fL (ref 80.0–100.0)
Monocytes Absolute: 0.5 10*3/uL (ref 0.1–1.0)
Monocytes Relative: 6 %
Neutro Abs: 3.6 10*3/uL (ref 1.7–7.7)
Neutrophils Relative %: 50 %
Platelets: 223 10*3/uL (ref 150–400)
RBC: 5.42 MIL/uL (ref 4.22–5.81)
RDW: 12.8 % (ref 11.5–15.5)
WBC: 7.4 10*3/uL (ref 4.0–10.5)
nRBC: 0 % (ref 0.0–0.2)

## 2020-08-07 LAB — URINALYSIS, ROUTINE W REFLEX MICROSCOPIC
Bilirubin Urine: NEGATIVE
Glucose, UA: NEGATIVE mg/dL
Hgb urine dipstick: NEGATIVE
Ketones, ur: NEGATIVE mg/dL
Leukocytes,Ua: NEGATIVE
Nitrite: NEGATIVE
Protein, ur: NEGATIVE mg/dL
Specific Gravity, Urine: 1.004 — ABNORMAL LOW (ref 1.005–1.030)
pH: 6 (ref 5.0–8.0)

## 2020-08-07 LAB — LIPASE, BLOOD: Lipase: 27 U/L (ref 11–51)

## 2020-08-07 MED ORDER — HYDROMORPHONE HCL 1 MG/ML IJ SOLN
0.5000 mg | Freq: Once | INTRAMUSCULAR | Status: AC
Start: 2020-08-07 — End: 2020-08-07
  Administered 2020-08-07: 0.5 mg via INTRAVENOUS
  Filled 2020-08-07: qty 1

## 2020-08-07 MED ORDER — ONDANSETRON HCL 4 MG/2ML IJ SOLN
4.0000 mg | Freq: Once | INTRAMUSCULAR | Status: AC
  Administered 2020-08-07: 4 mg via INTRAVENOUS
  Filled 2020-08-07: qty 2

## 2020-08-07 MED ORDER — METHYLPREDNISOLONE SODIUM SUCC 125 MG IJ SOLR
80.0000 mg | Freq: Once | INTRAMUSCULAR | Status: AC
  Administered 2020-08-07: 80 mg via INTRAVENOUS
  Filled 2020-08-07: qty 2

## 2020-08-07 MED ORDER — HYDROMORPHONE HCL 1 MG/ML IJ SOLN
1.0000 mg | Freq: Once | INTRAMUSCULAR | Status: AC
Start: 2020-08-07 — End: 2020-08-07
  Administered 2020-08-07: 1 mg via INTRAVENOUS
  Filled 2020-08-07: qty 1

## 2020-08-07 MED ORDER — KETOROLAC TROMETHAMINE 30 MG/ML IJ SOLN
30.0000 mg | Freq: Once | INTRAMUSCULAR | Status: AC
  Administered 2020-08-07: 30 mg via INTRAVENOUS

## 2020-08-07 MED ORDER — OXYCODONE-ACETAMINOPHEN 5-325 MG PO TABS: 1.0000 | ORAL_TABLET | Freq: Three times a day (TID) | ORAL | 0 refills | Status: AC | PRN

## 2020-08-07 MED ORDER — DIAZEPAM 5 MG/ML IJ SOLN
5.0000 mg | Freq: Once | INTRAMUSCULAR | Status: AC
  Administered 2020-08-07: 5 mg via INTRAVENOUS
  Filled 2020-08-07: qty 2

## 2020-08-07 MED ORDER — SODIUM CHLORIDE 0.9 % IV BOLUS
1000.0000 mL | Freq: Once | INTRAVENOUS | Status: AC
  Administered 2020-08-07: 1000 mL via INTRAVENOUS

## 2020-08-07 MED ORDER — GABAPENTIN 100 MG PO CAPS: 100.0000 mg | ORAL_CAPSULE | Freq: Three times a day (TID) | ORAL | 0 refills | Status: AC

## 2020-08-07 NOTE — Discharge Instructions (Addendum)
As we discussed, your work-up today was reassuring.  It is very important that you follow-up with Dr. Venetia Maxon in his office.  Please call his office tomorrow and arrange for an appointment.  You can take Tylenol or Ibuprofen as directed for pain. You can alternate Tylenol and Ibuprofen every 4 hours. If you take Tylenol at 1pm, then you can take Ibuprofen at 5pm. Then you can take Tylenol again at 9pm.   Take pain medications as directed for break through pain. Do not drive or operate machinery while taking this medication.   Do not take the muscle relaxers at the same time as the pain medication.  Return to the Emergency Department immediately for any worsening back pain, neck pain, difficulty walking, numbness/weaknss of your arms or legs, urinary or bowel accidents, fever or any other worsening or concerning symptoms.

## 2020-08-07 NOTE — ED Triage Notes (Signed)
Patient reports persistent low back pain for several days unrelieved by prescription pain medications , denies injury /no urinary discomfort.

## 2020-08-07 NOTE — ED Notes (Signed)
Bladder scanner revealed . PA Mardella Layman made aware

## 2020-08-07 NOTE — ED Notes (Signed)
Patient transported to X-ray 

## 2020-08-07 NOTE — ED Provider Notes (Signed)
[facility] EMERGENCY DEPARTMENT Provider Note   CSN: [id number] Arrival date & time: 08/07/20  1958     History Chief Complaint  Patient presents with   Back Pain    Cody Fletcher is a 40 y.o. male past medical history of L5-S1 fusion, lumbar foraminal stenosis, lumbar radiculopathy, herniated disc he presents for evaluation of lower back pain that has been ongoing for last 3 weeks.  He has a history of lumbar fusion and follows with Dr. Venetia Maxon with neurosurgery.  He reports that about 3 weeks ago, he started experiencing lower back pain.  He felt like his muscle was spasming.  He states that the pain radiated into the left and went to his left buttock.  He states that it will occasionally go down his left leg.  He states that initially he went to urgent care and was evaluated.  He was given muscle relaxers and prednisone.  He states despite taking those medications, he continued to have symptoms.  He went to [facility] emergency department on 08/03/2020.  He was evaluated and given analgesics.  At that time, his work-up was reassuring.  He was discharged home with hydrocodone, prednisone, muscle relaxers, Mobic which he states he has been taking.  He did have some mild improvement but states it never went away.  He states over last 2 days, the pain has continued and worsened.  He states that it hurts more when he bends, moves, tries to walk.  He has still been able to ambulate but hurts when he does so.  He states that he has some tingling in his left lower extremity which he states he has had before.  He can still move all of his extremities.  He has not noted any fevers, trauma, saddle anesthesia, urinary or bowel incontinence, history of cancer, history of IV drug use, history of HIV.  He states he is now started developing some abdominal pain diffusely. No nausea/vomiting or diarrhea. He has not any dysuria, hematuria.  The history is provided by the patient.      Past Medical  History:  Diagnosis Date   Body mass index 28.0-28.9, adult    Disc displacement, lumbar    Elevated blood pressure reading without diagnosis of hypertension    Leg pain    Low back pain    With sciatica presence unspecified   Lumbar foraminal stenosis    Lumbar radiculopathy    Numbness and tingling of both legs    Occasional   Spondylosis of lumbar region without myelopathy or radiculopathy     Patient Active Problem List   Diagnosis Date Noted   Herniated lumbar disc without myelopathy 02/19/2019    Past Surgical History:  Procedure Laterality Date   ABDOMINAL EXPOSURE N/A 02/19/2019   Procedure: ABDOMINAL EXPOSURE;  Surgeon: Nada Libman, MD;  Location: [facility] OR;  Service: Vascular;  Laterality: N/A;   ANTERIOR LUMBAR FUSION N/A 02/19/2019   Procedure: Lumbar five Sacral one Anterior lumbar interbody fusion;  Surgeon: Maeola Harman, MD;  Location: [facility] OR;  Service: Neurosurgery;  Laterality: N/A;       Family History  Problem Relation Age of Onset   Arthritis Mother    Diabetes Father     Social History   Tobacco Use   Smoking status: Never   Smokeless tobacco: Never  Vaping Use   Vaping Use: Never used  Substance Use Topics   Alcohol use: Not Currently   Drug use: Never    Home  Medications Prior to Admission medications   Medication Sig Start Date End Date Taking? Authorizing Provider  gabapentin (NEURONTIN) 100 MG capsule Take 1 capsule (100 mg total) by mouth 3 (three) times daily for 7 days. 08/07/20 08/14/20 Yes Maxwell Caul, PA-C  HYDROcodone-acetaminophen (NORCO/VICODIN) 5-325 MG tablet Take 1 tablet by mouth every 6 (six) hours as needed for moderate pain.   Yes [provider]  meloxicam (MOBIC) 15 MG tablet Take 15 mg by mouth daily.   Yes [provider]  oxyCODONE-acetaminophen (PERCOCET/ROXICET) 5-325 MG tablet Take 1 tablet by mouth every 8 (eight) hours as needed for severe pain. 08/07/20  Yes Graciella Freer A, PA-C  tiZANidine  (ZANAFLEX) 4 MG tablet Take 4 mg by mouth every 6 (six) hours as needed for muscle spasms.   Yes [provider]  doxycycline (VIBRAMYCIN) 100 MG capsule Take 1 capsule (100 mg total) by mouth 2 (two) times daily. Take with food. Patient not taking: No sig reported 03/09/19   Lattie Haw, MD  methocarbamol (ROBAXIN) 500 MG tablet Take 1 tablet (500 mg total) by mouth every 6 (six) hours as needed for muscle spasms. Patient not taking: No sig reported 02/20/19   Maeola Harman, MD    Allergies    Dexamethasone  Review of Systems   Review of Systems  Constitutional:  Negative for fever.  Respiratory:  Negative for shortness of breath.   Cardiovascular:  Negative for chest pain.  Gastrointestinal:  Negative for abdominal pain, nausea and vomiting.  Genitourinary:  Negative for dysuria and hematuria.  Musculoskeletal:  Positive for back pain. Negative for neck pain.  Neurological:  Negative for weakness, numbness (tingling chronic) and headaches.  All other systems reviewed and are negative.  Physical Exam Updated Vital Signs BP 134/86   Pulse 88   Temp 98.6 F (37 C) (Oral)   Resp 14   SpO2 100%   Physical Exam Vitals and nursing note reviewed.  Constitutional:      Appearance: Normal appearance. He is well-developed.  HENT:     Head: Normocephalic and atraumatic.  Eyes:     General: Lids are normal.     Conjunctiva/sclera: Conjunctivae normal.     Pupils: Pupils are equal, round, and reactive to light.  Cardiovascular:     Rate and Rhythm: Regular rhythm. Tachycardia present.     Pulses: Normal pulses.          Radial pulses are 2+ on the right side and 2+ on the left side.       Dorsalis pedis pulses are 2+ on the right side and 2+ on the left side.     Heart sounds: Normal heart sounds. No murmur heard.   No friction rub. No gallop.  Pulmonary:     Effort: Pulmonary effort is normal.     Breath sounds: Normal breath sounds.  Abdominal:     Palpations:  Abdomen is soft. Abdomen is not rigid.     Tenderness: There is generalized abdominal tenderness. There is no guarding.     Comments: Abdomen soft, nondistended.  Generalized tenderness.  No focal point.  No rigidity. No CVA tenderness.   Musculoskeletal:        General: Normal range of motion.     Cervical back: Full passive range of motion without pain.     Comments: Diffuse tenderness palpation noted to the midline L-spine that extends into the left paraspinal muscles into the left gluteal region.  No overlying warmth, erythema.  No rash.  Skin:    General: Skin is warm and dry.     Capillary Refill: Capillary refill takes less than 2 seconds.  Neurological:     Mental Status: He is alert and oriented to person, place, and time.     Comments: Follows commands, Moves all extremities  5/5 strength to BUE and BLE  Sensation intact throughout all major nerve distributions SLR on LLE.  Psychiatric:        Speech: Speech normal.    ED Results / Procedures / Treatments   Labs (all labs ordered are listed, but only abnormal results are displayed) Labs Reviewed  COMPREHENSIVE METABOLIC PANEL - Abnormal; Notable for the following components:      Result Value   Glucose, Bld 114 (*)    All other components within normal limits  URINALYSIS, ROUTINE W REFLEX MICROSCOPIC - Abnormal; Notable for the following components:   Color, Urine COLORLESS (*)    Specific Gravity, Urine 1.004 (*)    All other components within normal limits  CBC WITH DIFFERENTIAL/PLATELET  LIPASE, BLOOD    EKG None  Radiology DG Lumbar Spine Complete  Result Date: 08/07/2020 CLINICAL DATA:  Back pain. EXAM: LUMBAR SPINE - COMPLETE 4+ VIEW COMPARISON:  June 10, 2019 FINDINGS: There is no evidence of lumbar spine fracture. Alignment is normal. Radiopaque operative material is seen within the L5-S1 intervertebral disc space consistent with the patient's history of prior anterior discectomy and fusion. Intervertebral  disc spaces are maintained throughout the remainder of the lumbar spine. IMPRESSION: 1. Evidence of prior anterior discectomy and fusion procedure at the level of L5-S1. 2. No acute osseous abnormality within the lumbar spine. Electronically Signed   By: Aram Candela M.D.   On: 08/07/2020 21:40    Procedures Procedures   Medications Ordered in ED Medications  sodium chloride 0.9 % bolus 1,000 mL (0 mLs Intravenous Stopped 08/07/20 2234)  methylPREDNISolone sodium succinate (SOLU-MEDROL) 125 mg/2 mL injection 80 mg (80 mg Intravenous Given 08/07/20 2143)  HYDROmorphone (DILAUDID) injection 1 mg (1 mg Intravenous Given 08/07/20 2142)  ondansetron (ZOFRAN) injection 4 mg (4 mg Intravenous Given 08/07/20 2140)  ketorolac (TORADOL) 30 MG/ML injection 30 mg (30 mg Intravenous Given 08/07/20 2254)  diazepam (VALIUM) injection 5 mg (5 mg Intravenous Given 08/07/20 2350)  HYDROmorphone (DILAUDID) injection 0.5 mg (0.5 mg Intravenous Given 08/07/20 2350)    ED Course  I have reviewed the triage vital signs and the nursing notes.  Pertinent labs & imaging results that were available during my care of the patient were reviewed by me and considered in my medical decision making (see chart for details).    MDM Rules/Calculators/A&P                          40 year old male who presents for evaluation of lower back pain x3 weeks.  He has been seen at urgent care and at [facility] emergency department.  He was discharged home with hydrocodone, muscle relaxers, prednisone which she states he has been taking but still having some pain.  No fevers, saddle anesthesia, urinary bowel incontinence.  No preceding trauma, injury, fall.  He does have a history of lumbar fusion and follows with Dr. Venetia Maxon.  He has not followed up with them.  He also reports now he is starting to have some abdominal pain.  No urinary complaints.  He states he does have some tingling in his left lower extremity which he  has had before.   He states the pain radiates down the posterior aspect of his left lower extremity.  History/physical exam sound consistent with sciatica.  History/physical exam not concerning for cauda equina, spinal abscess.  I doubt kidney stone as he is not having any urinary complaints.  He is diffusely tender in the paraspinal muscles with overlying spasm that indicates more MSK etiology.  Given that he is having abdominal pain, we will plan to check labs, urine.  Lipase is normal. CMP with normal BUN/Cr. CBC shows no leukocytosis. Hgb stable. UA is negative for infectious etiology.   XR of Lumbar spine shows evidence of prior anterior discectomy and fusion procedure at the level of L5-S1.  No acute bony abnormality.  Patient's postvoid residual is 102 mL.  Per up-to-date criteria, given that it is less than 200 mm, this is on call for acute urinary retention.  I discussed results with patient.  He does report improvement in back pain after analgesics here in the ED.  At this time, patient with no red flag symptoms.  He is afebrile here in the ED with reassuring work-up.  I suspect his symptoms are likely related to sciatica.  Do not suspect GU etiology given reassuring urine, labs here in the ED.  Additionally, given his reassuring work-up, story, do not feel that he needs CT on pelvis as do not suspect surgical abdomen.  Patient instructed to follow-up with neurosurgery.  Will give short course of pain medication for acute/breakthrough pain.  This time, patient able to move all extremities.  He can ambulate but does report worsening pain.  No saddle anesthesia, urinary bowel incontinence.  No urinary retention noted.  No indication for emergent MRI. Patient stable for discharge. Discussed patient with Dr. Madilyn Hookees who is agreeable to plan. Patient had ample opportunity for questions and discussion. All patient's questions were answered with full understanding. Strict return precautions discussed. Patient expresses  understanding and agreement to plan.   Portions of this note were generated with Scientist, clinical (histocompatibility and immunogenetics)Dragon dictation software. Dictation errors may occur despite best attempts at proofreading.   Final Clinical Impression(s) / ED Diagnoses Final diagnoses:  Acute left-sided low back pain with left-sided sciatica  Generalized abdominal pain    Rx / DC Orders ED Discharge Orders          Ordered    oxyCODONE-acetaminophen (PERCOCET/ROXICET) 5-325 MG tablet  Every 8 hours PRN        08/07/20 2350    gabapentin (NEURONTIN) 100 MG capsule  3 times daily        08/07/20 2350             Rosana HoesLayden, Akiko Schexnider A, PA-C 08/07/20 2359    Tilden Fossaees, Elizabeth, MD 08/08/20 2255

## 2021-05-30 IMAGING — RF DG C-ARM 1-60 MIN
1 series · 3 of 3 positions shown · IV contrast (agent unspecified)
Comparison: Lumbar spine 09/24/2018.

CLINICAL DATA: L5-S1 fusion.

EXAM:
DG C-ARM 1-60 MIN; LUMBAR SPINE - 2-3 VIEW
CONTRAST:  None.
FLUOROSCOPY TIME:  Fluoroscopy Time:  0 minutes 32 seconds
Number of Acquired Spot Images: 3

[Series 1: run · 3 of 3 slices shown]
[im 1/3]
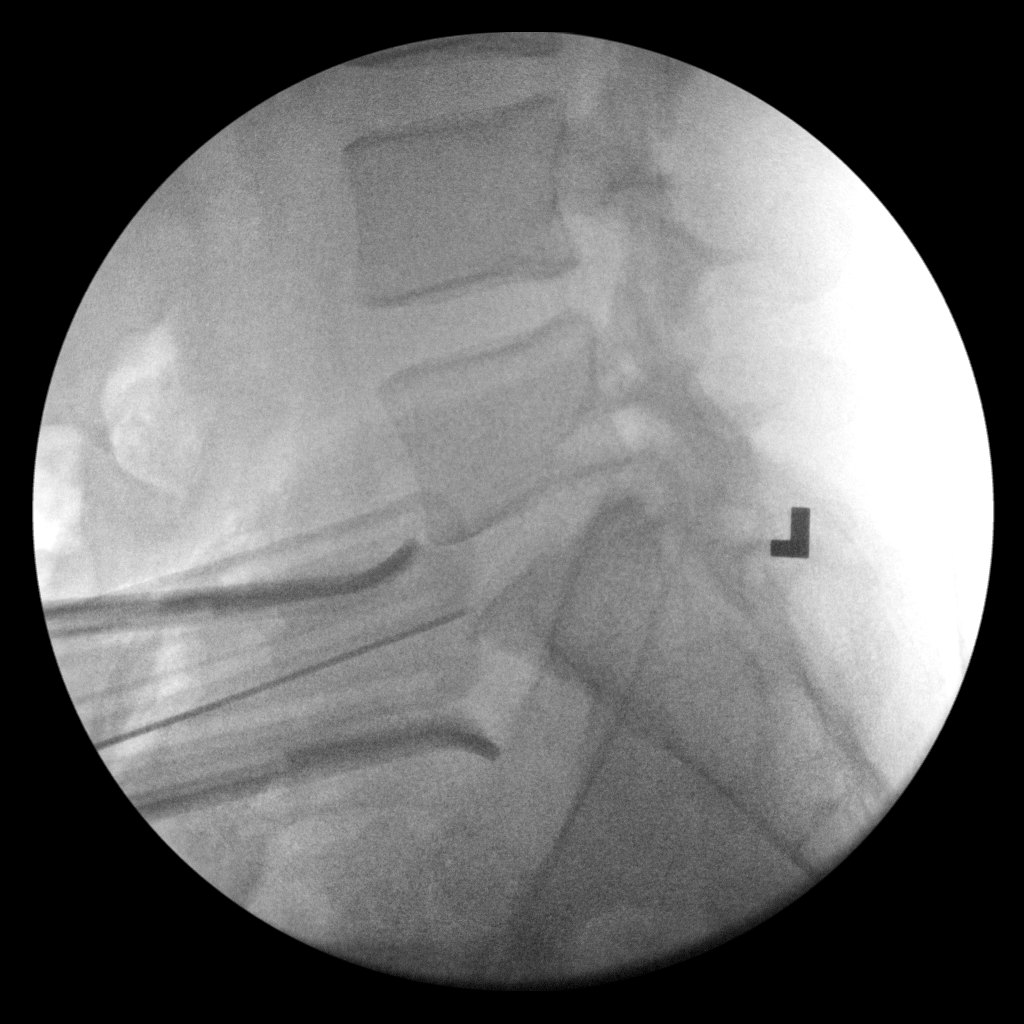
[im 2/3]
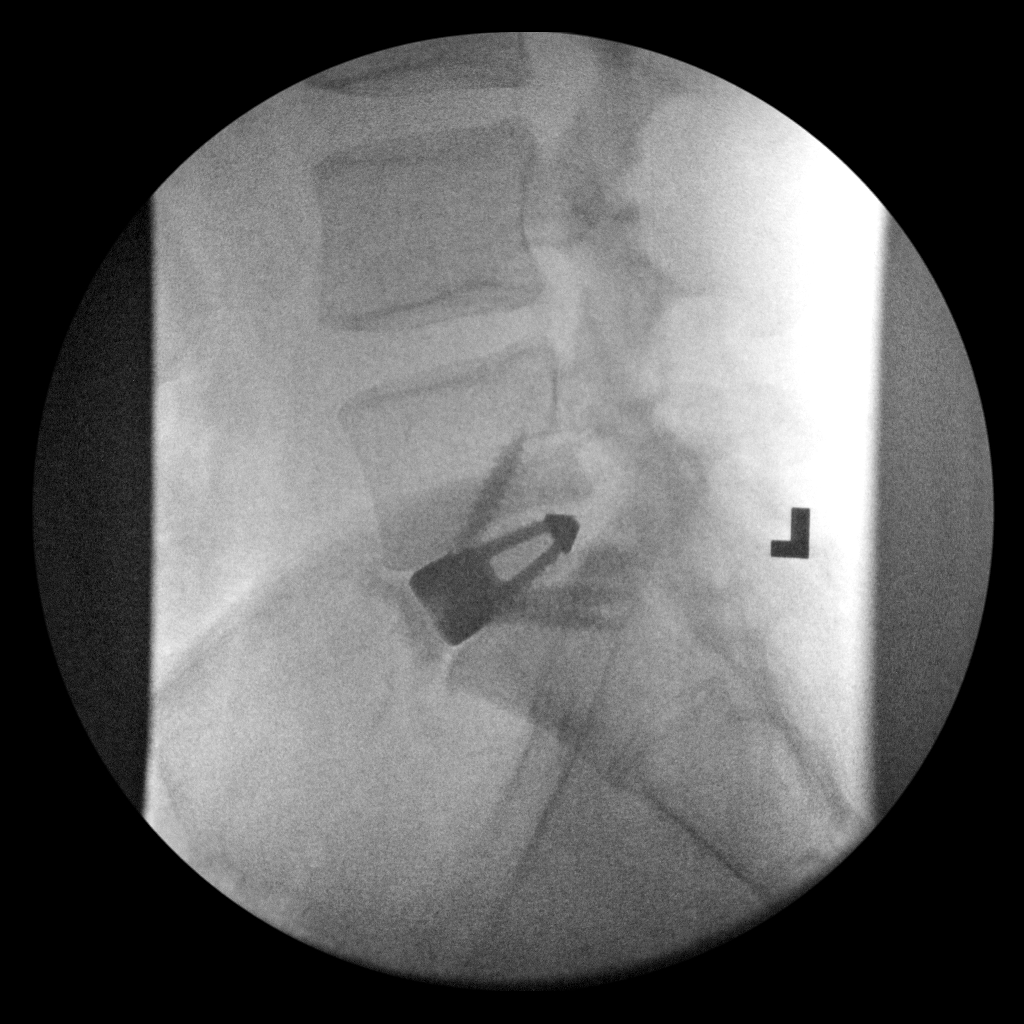
[im 3/3]
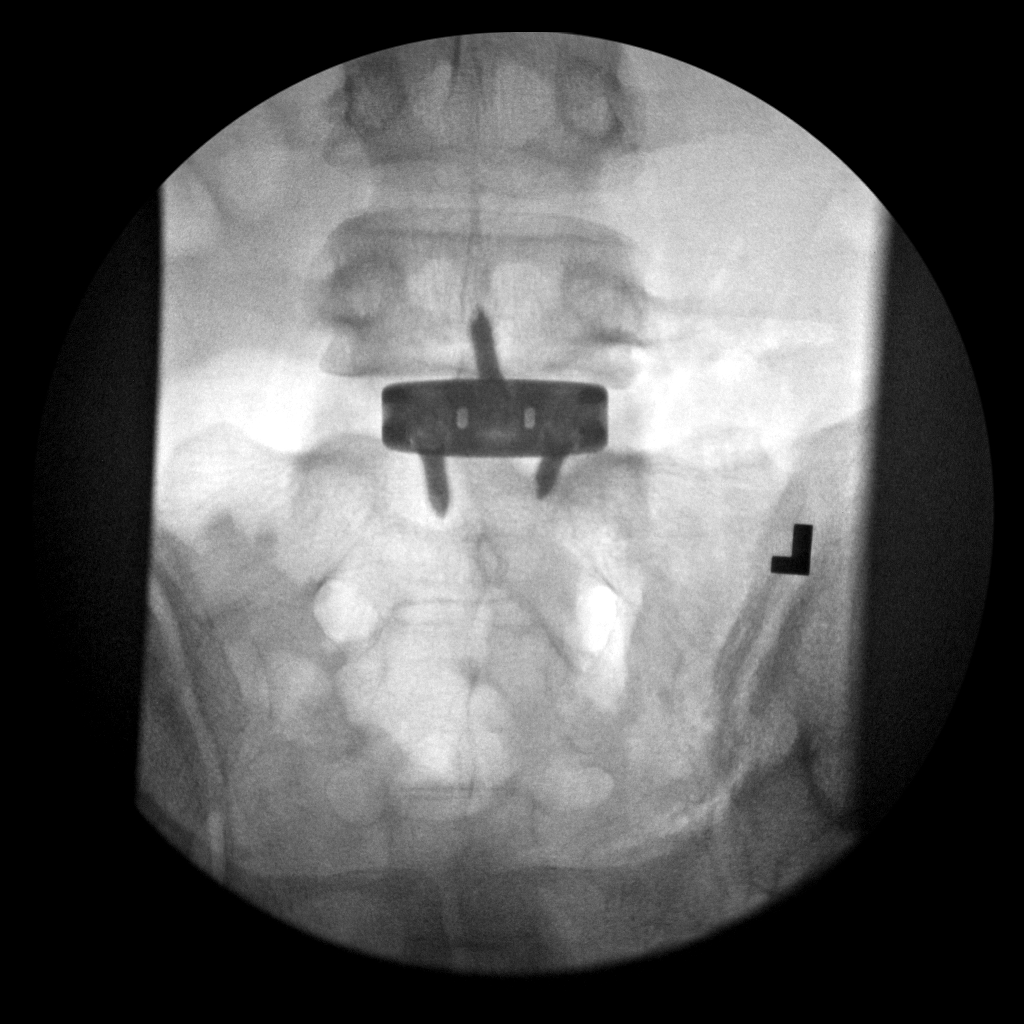

[3 of 3 positions shown; findings below may reference images not displayed]

FINDINGS: Lumbar spine numbered the lowest segmented appearing lumbar shaped
vertebrae on lateral view as L5. L5-S1 fusion. Hardware intact.
Anatomic alignment. No acute bony abnormality identified.
IMPRESSION: L5-S1 fusion.  Hardware intact.  Anatomic alignment

## 2021-05-30 IMAGING — CR DG OR LOCAL ABDOMEN
1 series · 1 of 1 positions shown · non-contrast
Comparison: Prior Study 02/19/2019.

CLINICAL DATA: Postoperative evaluation for foreign body.

EXAM:
OR LOCAL ABDOMEN

[AP]
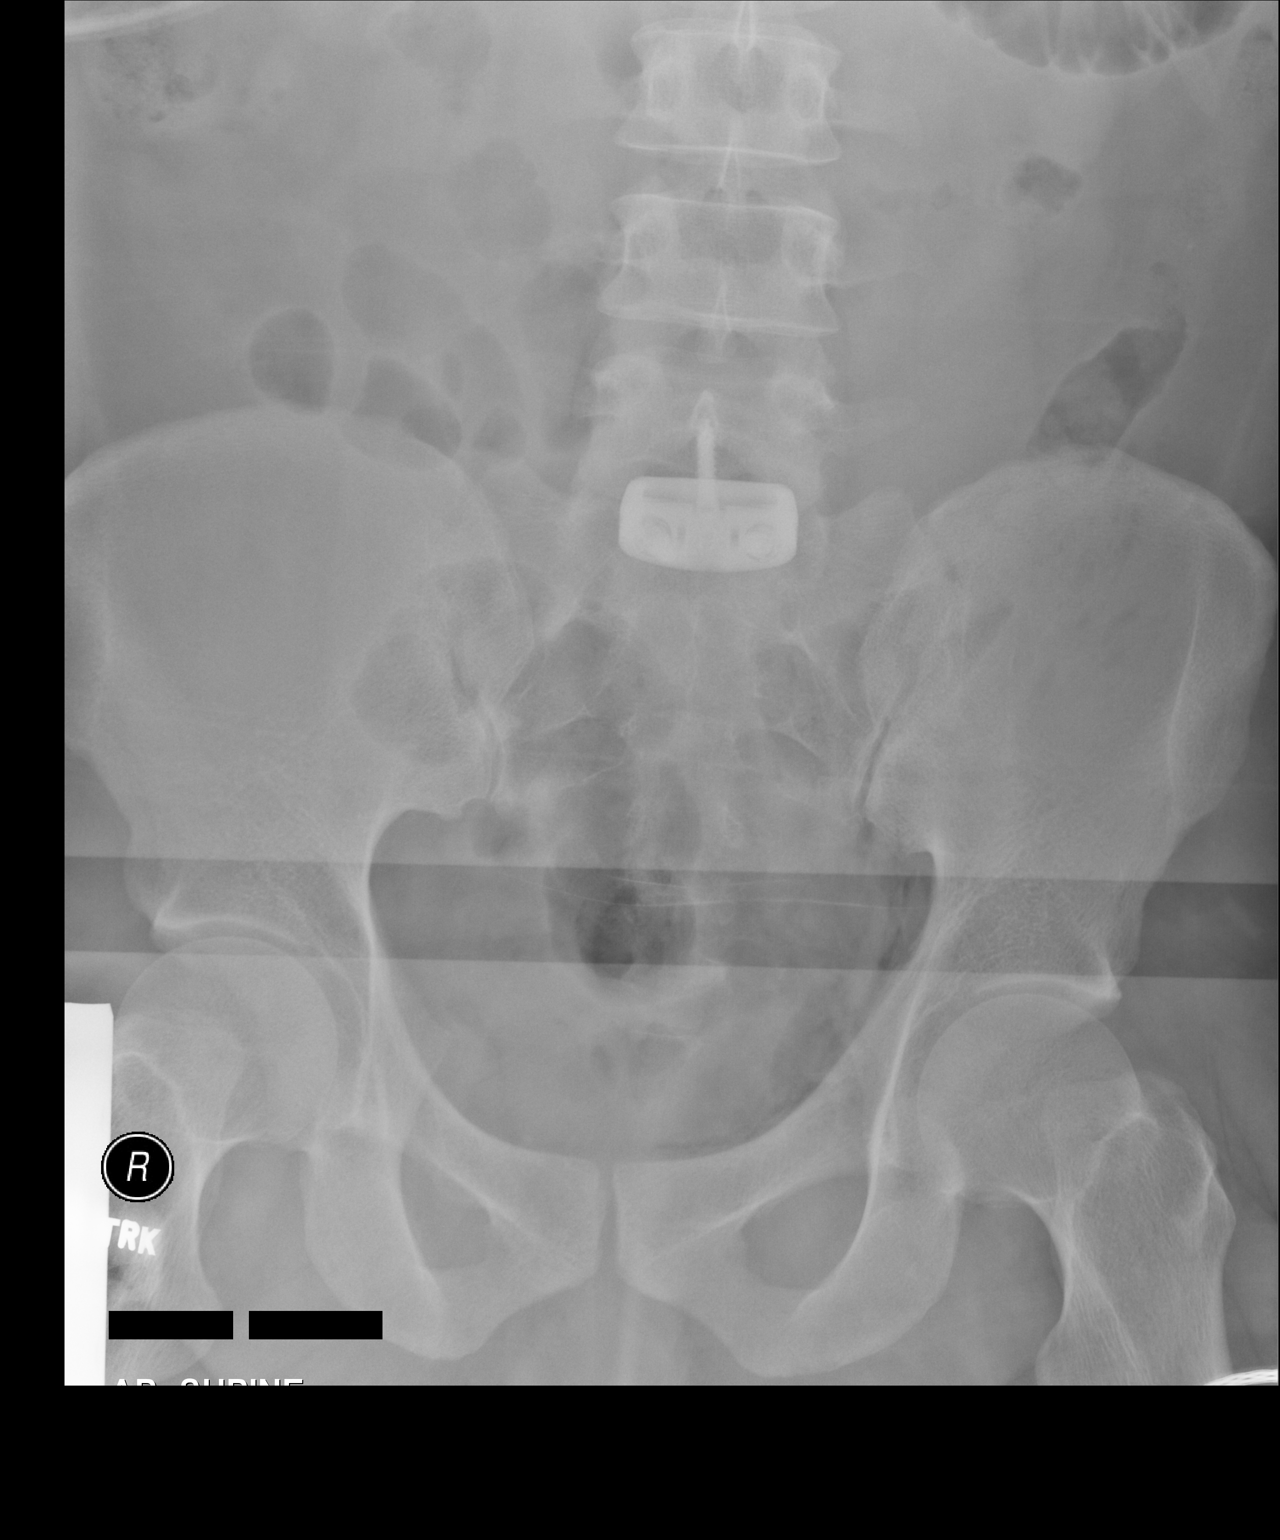

[1 of 1 positions shown; findings below may reference images not displayed]

FINDINGS: Patient is status post lumbosacral fusion. Hardware intact. Anatomic
alignment. No retained metallic foreign bodies noted. No bowel
distention.
IMPRESSION: No retained metallic foreign bodies noted.
